# Patient Record
Sex: Female | Born: 1950 | Race: Black or African American | Hispanic: No | Marital: Single | Smoking: Never smoker
Health system: Southern US, Community
[De-identification: ages and names within clinical notes are randomized; demographics above are authoritative.]

## PROBLEM LIST (undated history)

## (undated) DIAGNOSIS — I10 Essential (primary) hypertension: Secondary | ICD-10-CM

## (undated) HISTORY — PX: TUBAL LIGATION: SHX77

## (undated) HISTORY — PX: ROTATOR CUFF REPAIR: SHX139

---

## 2000-02-09 ENCOUNTER — Emergency Department (HOSPITAL_COMMUNITY): Admission: EM | Admit: 2000-02-09 | Discharge: 2000-02-09 | Payer: Self-pay | Admitting: Emergency Medicine

## 2000-03-17 ENCOUNTER — Emergency Department (HOSPITAL_COMMUNITY): Admission: EM | Admit: 2000-03-17 | Discharge: 2000-03-17 | Payer: Self-pay | Admitting: Emergency Medicine

## 2008-02-27 ENCOUNTER — Emergency Department (HOSPITAL_BASED_OUTPATIENT_CLINIC_OR_DEPARTMENT_OTHER): Admission: EM | Admit: 2008-02-27 | Discharge: 2008-02-27 | Payer: Self-pay | Admitting: Emergency Medicine

## 2008-12-02 ENCOUNTER — Emergency Department (HOSPITAL_BASED_OUTPATIENT_CLINIC_OR_DEPARTMENT_OTHER): Admission: EM | Admit: 2008-12-02 | Discharge: 2008-12-02 | Payer: Self-pay | Admitting: Emergency Medicine

## 2010-06-05 ENCOUNTER — Emergency Department (INDEPENDENT_AMBULATORY_CARE_PROVIDER_SITE_OTHER): Payer: Self-pay

## 2010-06-05 ENCOUNTER — Emergency Department (HOSPITAL_BASED_OUTPATIENT_CLINIC_OR_DEPARTMENT_OTHER)
Admission: EM | Admit: 2010-06-05 | Discharge: 2010-06-05 | Disposition: A | Discharge status: Home / Self Care | Payer: Self-pay | Attending: Emergency Medicine | Admitting: Emergency Medicine

## 2010-06-05 DIAGNOSIS — X500XXA Overexertion from strenuous movement or load, initial encounter: Secondary | ICD-10-CM | POA: Insufficient documentation

## 2010-06-05 DIAGNOSIS — Y9289 Other specified places as the place of occurrence of the external cause: Secondary | ICD-10-CM | POA: Insufficient documentation

## 2010-06-05 DIAGNOSIS — I10 Essential (primary) hypertension: Secondary | ICD-10-CM | POA: Insufficient documentation

## 2010-06-05 DIAGNOSIS — Y9269 Other specified industrial and construction area as the place of occurrence of the external cause: Secondary | ICD-10-CM

## 2010-06-05 DIAGNOSIS — S6390XA Sprain of unspecified part of unspecified wrist and hand, initial encounter: Secondary | ICD-10-CM | POA: Insufficient documentation

## 2010-06-05 DIAGNOSIS — Y93F9 Activity, other caregiving: Secondary | ICD-10-CM

## 2010-06-05 DIAGNOSIS — M79609 Pain in unspecified limb: Secondary | ICD-10-CM | POA: Insufficient documentation

## 2012-01-25 ENCOUNTER — Emergency Department (HOSPITAL_BASED_OUTPATIENT_CLINIC_OR_DEPARTMENT_OTHER)
Admission: EM | Admit: 2012-01-25 | Discharge: 2012-01-25 | Disposition: A | Discharge status: Home / Self Care | Payer: Worker's Compensation | Attending: Emergency Medicine | Admitting: Emergency Medicine

## 2012-01-25 ENCOUNTER — Emergency Department (HOSPITAL_BASED_OUTPATIENT_CLINIC_OR_DEPARTMENT_OTHER): Payer: Worker's Compensation

## 2012-01-25 ENCOUNTER — Encounter (HOSPITAL_BASED_OUTPATIENT_CLINIC_OR_DEPARTMENT_OTHER): Payer: Self-pay | Admitting: Family Medicine

## 2012-01-25 DIAGNOSIS — Y9210 Unspecified residential institution as the place of occurrence of the external cause: Secondary | ICD-10-CM | POA: Insufficient documentation

## 2012-01-25 DIAGNOSIS — Y93F2 Activity, caregiving, lifting: Secondary | ICD-10-CM | POA: Insufficient documentation

## 2012-01-25 DIAGNOSIS — S4980XA Other specified injuries of shoulder and upper arm, unspecified arm, initial encounter: Secondary | ICD-10-CM | POA: Insufficient documentation

## 2012-01-25 DIAGNOSIS — S46909A Unspecified injury of unspecified muscle, fascia and tendon at shoulder and upper arm level, unspecified arm, initial encounter: Secondary | ICD-10-CM | POA: Insufficient documentation

## 2012-01-25 DIAGNOSIS — Y990 Civilian activity done for income or pay: Secondary | ICD-10-CM | POA: Insufficient documentation

## 2012-01-25 DIAGNOSIS — S46001A Unspecified injury of muscle(s) and tendon(s) of the rotator cuff of right shoulder, initial encounter: Secondary | ICD-10-CM

## 2012-01-25 DIAGNOSIS — X500XXA Overexertion from strenuous movement or load, initial encounter: Secondary | ICD-10-CM | POA: Insufficient documentation

## 2012-01-25 MED ORDER — OXYCODONE-ACETAMINOPHEN 5-325 MG PO TABS: 1.0000 | ORAL_TABLET | ORAL | Status: DC | PRN

## 2012-01-25 MED ORDER — NAPROXEN 500 MG PO TABS: 500.0000 mg | ORAL_TABLET | Freq: Two times a day (BID) | ORAL | Status: DC

## 2012-01-25 NOTE — ED Notes (Signed)
Pt c/o right shoulder pain x 2 wks. Pt reports lifting heavy pt at onset and limited rom since. Pt sts she has not taken otc meds.

## 2012-01-25 NOTE — ED Provider Notes (Signed)
History     CSN: [id number]  Arrival date & time 01/25/12  1038   First MD Initiated Contact with Patient 01/25/12 1142      Chief Complaint  Patient presents with  . Shoulder Pain    (Consider location/radiation/quality/duration/timing/severity/associated sxs/prior treatment) Patient is a 61 y.o. female presenting with shoulder pain. The history is provided by the patient.  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 Since then, she has had ongoing pain in her right shoulder which radiates to the right upper chest. Pain is severe and she rates it 10/10. Is worse with movement of the shoulder, worse at night, worse if she tries to prop her head on her right arm. She has taken ibuprofen with no significant improvement. She denies weakness, numbness, tingling.  History reviewed. No pertinent past medical history.  Past Surgical History  Procedure Date  . Tubal ligation   . Rotator cuff repair     left    No family history on file.  History  Substance Use Topics  . Smoking status: Never Smoker   . Smokeless tobacco: Not on file  . Alcohol Use: No    OB History    Grav Para Term Preterm Abortions TAB SAB Ect Mult Living                  Review of Systems  All other systems reviewed and are negative.    Allergies  Review of patient's allergies indicates no known allergies.  Home Medications  No current outpatient prescriptions on file.  BP 140/60  Pulse 65  Temp 97.7 F (36.5 C) (Oral)  Resp 16  Ht 5\' 5"  (1.651 m)  Wt 210 lb (95.255 kg)  BMI 34.95 kg/m2  SpO2 100%  Physical Exam  Nursing note and vitals reviewed. 61 year old female, resting comfortably and in no acute distress. Vital signs are normal. Oxygen saturation is 100%, which is normal. Head is normocephalic and atraumatic. PERRLA, EOMI. Oropharynx is clear. Neck is nontender and supple without adenopathy or JVD. Back is nontender and there is no  CVA tenderness. Lungs are clear without rales, wheezes, or rhonchi. Chest is nontender. Heart has regular rate and rhythm without murmur. Abdomen is soft, flat, nontender without masses or hepatosplenomegaly and peristalsis is normoactive. Extremities have no cyanosis or edema. There is moderate tenderness of the right shoulder in the anterior deltoid groove. She has limited abduction to approximately 70. Rotator cuff impingement signs are present. Neurovascular exam is intact with prompt capillary refill, normal sensation, and normal strength of distal muscles. Skin is warm and dry without rash. Neurologic: Mental status is normal, cranial nerves are intact, there are no motor or sensory deficits.   ED Course  Procedures (including critical care time)  Dg Shoulder Right  01/25/2012  *RADIOLOGY REPORT*  Clinical Data: Right shoulder pain  RIGHT SHOULDER - 2+ VIEW  Comparison: None.  Findings: No fracture or dislocation.  Right lung apex is clear. AC joint distance is normal allowing for technique.  IMPRESSION: No right shoulder fracture or dislocation.   Original Report Authenticated By: Harrel Lemon, M.D.      1. Injury of right rotator cuff       MDM  Rotator cuff injury on the right shoulder. She is discharged with prescription for naproxen and Percocet and referred to the on-call orthopedic specialist.        Dione Booze, MD 01/25/12 1224

## 2012-04-01 ENCOUNTER — Encounter (HOSPITAL_BASED_OUTPATIENT_CLINIC_OR_DEPARTMENT_OTHER): Payer: Self-pay | Admitting: Emergency Medicine

## 2012-04-01 ENCOUNTER — Emergency Department (HOSPITAL_BASED_OUTPATIENT_CLINIC_OR_DEPARTMENT_OTHER)
Admission: EM | Admit: 2012-04-01 | Discharge: 2012-04-02 | Disposition: A | Discharge status: Home / Self Care | Payer: Self-pay | Attending: Emergency Medicine | Admitting: Emergency Medicine

## 2012-04-01 DIAGNOSIS — X58XXXA Exposure to other specified factors, initial encounter: Secondary | ICD-10-CM | POA: Insufficient documentation

## 2012-04-01 DIAGNOSIS — M719 Bursopathy, unspecified: Secondary | ICD-10-CM | POA: Insufficient documentation

## 2012-04-01 DIAGNOSIS — Y939 Activity, unspecified: Secondary | ICD-10-CM | POA: Insufficient documentation

## 2012-04-01 DIAGNOSIS — Y929 Unspecified place or not applicable: Secondary | ICD-10-CM | POA: Insufficient documentation

## 2012-04-01 DIAGNOSIS — M67919 Unspecified disorder of synovium and tendon, unspecified shoulder: Secondary | ICD-10-CM | POA: Insufficient documentation

## 2012-04-01 DIAGNOSIS — Z791 Long term (current) use of non-steroidal anti-inflammatories (NSAID): Secondary | ICD-10-CM | POA: Insufficient documentation

## 2012-04-01 NOTE — ED Notes (Signed)
Pt denies taking any OTC meds tonight for pain.

## 2012-04-01 NOTE — ED Notes (Signed)
Pt c/o right shoulder pain. Pt has been seen here for same previously

## 2012-04-02 ENCOUNTER — Encounter (HOSPITAL_BASED_OUTPATIENT_CLINIC_OR_DEPARTMENT_OTHER): Payer: Self-pay | Admitting: Emergency Medicine

## 2012-04-02 MED ORDER — OXYCODONE-ACETAMINOPHEN 5-325 MG PO TABS: 1.0000 | ORAL_TABLET | Freq: Four times a day (QID) | ORAL | Status: DC | PRN

## 2012-04-02 MED ORDER — NAPROXEN 375 MG PO TABS: 375.0000 mg | ORAL_TABLET | Freq: Two times a day (BID) | ORAL | Status: DC

## 2012-04-02 NOTE — ED Provider Notes (Signed)
History     CSN: [id number]  Arrival date & time 04/01/12  2334   First MD Initiated Contact with Patient 04/01/12 2356      Chief Complaint  Patient presents with  . Shoulder Injury    (Consider location/radiation/quality/duration/timing/severity/associated sxs/prior treatment) Patient is a 61 y.o. female presenting with shoulder injury. The history is provided by the patient. No language interpreter was used.  Shoulder Injury This is a chronic problem. The current episode started more than 1 week ago. The problem has not changed since onset.Pertinent negatives include no chest pain, no abdominal pain, no headaches and no shortness of breath. Exacerbated by: lifting the right arm. Nothing relieves the symptoms. She has tried nothing for the symptoms. The treatment provided no relief.  Has had rotator cuff problems with negative XR.  Was referred to orthopedics for ongoing care but has not followed up.  No new injuries no weakness no numbness.    History reviewed. No pertinent past medical history.  Past Surgical History  Procedure Date  . Tubal ligation   . Rotator cuff repair     left    No family history on file.  History  Substance Use Topics  . Smoking status: Never Smoker   . Smokeless tobacco: Not on file  . Alcohol Use: No    OB History    Grav Para Term Preterm Abortions TAB SAB Ect Mult Living                  Review of Systems  Respiratory: Negative for chest tightness and shortness of breath.   Cardiovascular: Negative for chest pain and palpitations.  Gastrointestinal: Negative for abdominal pain.  Neurological: Negative for headaches.  All other systems reviewed and are negative.    Allergies  Review of patient's allergies indicates no known allergies.  Home Medications   Current Outpatient Rx  Name  Route  Sig  Dispense  Refill  . NAPROXEN 500 MG PO TABS   Oral   Take 1 tablet (500 mg total) by mouth 2 (two) times daily.   30 tablet    0   . OXYCODONE-ACETAMINOPHEN 5-325 MG PO TABS   Oral   Take 1 tablet by mouth every 4 (four) hours as needed for pain.   20 tablet   0     BP 155/81  Pulse 67  Temp 98.2 F (36.8 C) (Oral)  Resp 18  SpO2 99%  Physical Exam  Constitutional: She is oriented to person, place, and time. She appears well-developed and well-nourished. No distress.  HENT:  Head: Normocephalic and atraumatic.  Mouth/Throat: Oropharynx is clear and moist.  Eyes: Conjunctivae normal are normal. Pupils are equal, round, and reactive to light.  Neck: Normal range of motion. Neck supple. No tracheal deviation present.       No C T step offs or pain.    Cardiovascular: Normal rate, regular rhythm and intact distal pulses.   Pulmonary/Chest: Effort normal and breath sounds normal. She has no wheezes. She has no rales.  Abdominal: Soft. Bowel sounds are normal. There is no tenderness.  Musculoskeletal: Normal range of motion. She exhibits no edema and no tenderness.       No winging of the scapula Negative NEERs tests of the right shoulder.  Intact biceps tendon.  5/5 RUE strength cap refill < 2 sec to all fingers of the right hand no snuff box tenderness.  Sensation intact to all nerve distributions  Neurological: She is alert and  oriented to person, place, and time. She has normal reflexes.  Skin: Skin is warm and dry.  Psychiatric: She has a normal mood and affect.    ED Course  Procedures (including critical care time)  Labs Reviewed - No data to display No results found.   No diagnosis found.    MDM  Follow up with orthopedics for ongoing care.  Will give short course of pain meds.  Patient verbalizes understanding and agrees to follow up       Delilah Mulgrew Smitty Cords, MD 04/02/12 1610

## 2012-04-02 NOTE — ED Notes (Signed)
Rx x 2 given for naprosyn and percocet

## 2014-02-27 ENCOUNTER — Emergency Department (HOSPITAL_BASED_OUTPATIENT_CLINIC_OR_DEPARTMENT_OTHER)
Admission: EM | Admit: 2014-02-27 | Discharge: 2014-02-27 | Disposition: A | Discharge status: Home / Self Care | Payer: Worker's Compensation | Attending: Emergency Medicine | Admitting: Emergency Medicine

## 2014-02-27 ENCOUNTER — Emergency Department (HOSPITAL_BASED_OUTPATIENT_CLINIC_OR_DEPARTMENT_OTHER): Payer: Worker's Compensation

## 2014-02-27 ENCOUNTER — Encounter (HOSPITAL_BASED_OUTPATIENT_CLINIC_OR_DEPARTMENT_OTHER): Payer: Self-pay | Admitting: Emergency Medicine

## 2014-02-27 DIAGNOSIS — Y998 Other external cause status: Secondary | ICD-10-CM | POA: Insufficient documentation

## 2014-02-27 DIAGNOSIS — W07XXXA Fall from chair, initial encounter: Secondary | ICD-10-CM | POA: Insufficient documentation

## 2014-02-27 DIAGNOSIS — S3992XA Unspecified injury of lower back, initial encounter: Secondary | ICD-10-CM | POA: Diagnosis not present

## 2014-02-27 DIAGNOSIS — Y9289 Other specified places as the place of occurrence of the external cause: Secondary | ICD-10-CM | POA: Diagnosis not present

## 2014-02-27 DIAGNOSIS — Y9389 Activity, other specified: Secondary | ICD-10-CM | POA: Diagnosis not present

## 2014-02-27 DIAGNOSIS — S199XXA Unspecified injury of neck, initial encounter: Secondary | ICD-10-CM | POA: Diagnosis not present

## 2014-02-27 DIAGNOSIS — S4990XA Unspecified injury of shoulder and upper arm, unspecified arm, initial encounter: Secondary | ICD-10-CM | POA: Insufficient documentation

## 2014-02-27 DIAGNOSIS — S99921A Unspecified injury of right foot, initial encounter: Secondary | ICD-10-CM | POA: Diagnosis not present

## 2014-02-27 DIAGNOSIS — W19XXXA Unspecified fall, initial encounter: Secondary | ICD-10-CM

## 2014-02-27 NOTE — ED Provider Notes (Signed)
CSN: [id number]     Arrival date & time 11/[id number]/15  1527 History   First MD Initiated Contact with Patient 02/27/14 1540     Chief Complaint  Patient presents with  . Fall     (Consider location/radiation/quality/duration/timing/severity/associated sxs/prior Treatment) Patient is a 63 y.o. female presenting with fall.  Fall This is a new problem. Episode onset: 10 days ago. The problem occurs constantly. The problem has not changed since onset.Pertinent negatives include no chest pain, no abdominal pain, no headaches and no shortness of breath. Nothing aggravates the symptoms. Nothing relieves the symptoms. She has tried nothing for the symptoms.    No past medical history on file. Past Surgical History  Procedure Laterality Date  . Tubal ligation    . Rotator cuff repair      left   No family history on file. History  Substance Use Topics  . Smoking status: Never Smoker   . Smokeless tobacco: Not on file  . Alcohol Use: No   OB History    No data available     Review of Systems  Respiratory: Negative for shortness of breath.   Cardiovascular: Negative for chest pain.  Gastrointestinal: Negative for abdominal pain.  Neurological: Negative for headaches.  All other systems reviewed and are negative.     Allergies  Review of patient's allergies indicates no known allergies.  Home Medications   Prior to Admission medications   Not on File   BP 148/87 mmHg  Pulse 74  Temp(Src) 98.2 F (36.8 C) (Oral)  Resp 20  Ht 5\' 4"  (1.626 m)  Wt 229 lb (103.874 kg)  BMI 39.29 kg/m2  SpO2 100% Physical Exam  Constitutional: She is oriented to person, place, and time. She appears well-developed and well-nourished.  HENT:  Head: Normocephalic and atraumatic.  Right Ear: External ear normal.  Left Ear: External ear normal.  Eyes: Conjunctivae and EOM are normal. Pupils are equal, round, and reactive to light.  Neck: Normal range of motion. Neck supple.  Cardiovascular:  Normal rate, regular rhythm, normal heart sounds and intact distal pulses.   Pulmonary/Chest: Effort normal and breath sounds normal.  Abdominal: Soft. Bowel sounds are normal. There is no tenderness.  Musculoskeletal: Normal range of motion.  Tenderness to midline cervical spine, upper t spine, scapula, and R dorsum of foot  Neurological: She is alert and oriented to person, place, and time.  Skin: Skin is warm and dry.  Vitals reviewed.   ED Course  Procedures (including critical care time) Labs Review Labs Reviewed - No data to display  Imaging Review Dg Cervical Spine Complete  02/27/2014   CLINICAL DATA:  63 year old female with neck pain after falling on the job 2 weeks ago  EXAM: CERVICAL SPINE  4+ VIEWS  COMPARISON:  CT scan of the head and cervical spine 02/27/2008  FINDINGS: No evidence of acute fracture or malalignment. No prevertebral soft tissue swelling. Mild multilevel cervical spondylosis at C3-C4, C4-C5 and C5-C6. Minimal change noted at C6-C7. No evidence of foraminal stenosis on the oblique radiographs. The dens is intact. Normal bony mineralization. No lytic of blastic osseous lesion. Unremarkable visualized upper lungs.  IMPRESSION: 1. No acute fracture or malalignment. 2. Mild multilevel cervical spondylosis without evidence of bony foraminal stenosis.   Electronically Signed   By: Malachy MoanHeath  McCullough M.D.   On: 02/27/2014 16:57   Dg Thoracic Spine 2 View  02/27/2014   CLINICAL DATA:  Larey SeatFell 2 weeks ago on the job. Persistent mid back  pain.  EXAM: THORACIC SPINE - 2 VIEW  COMPARISON:  None.  FINDINGS: Normal alignment of the thoracic vertebral bodies. Disc spaces and vertebral bodies are maintained. No acute compression fracture. No abnormal paraspinal soft tissue thickening. The visualized posterior ribs are intact.  IMPRESSION: Normal alignment and no acute bony findings. Mild to moderate degenerative changes.   Electronically Signed   By: Loralie ChampagneMark  Gallerani M.D.   On:  02/27/2014 16:57   Dg Shoulder Right  02/27/2014   CLINICAL DATA:  Larey SeatFell on the job 2 weeks ago. Persistent shoulder pain.  EXAM: RIGHT SHOULDER - 2+ VIEW  COMPARISON:  01/25/2012.  FINDINGS: The joint spaces are maintained. Mild AC and glenohumeral joint degenerative changes. No acute fracture. The visualized right ribs are intact and the right lung apex is clear.  IMPRESSION: Mild degenerative changes but no acute bony findings.   Electronically Signed   By: Loralie ChampagneMark  Gallerani M.D.   On: 02/27/2014 16:58   Dg Foot Complete Right  02/27/2014   CLINICAL DATA:  Fall x 2 weeks again on the job. [facility]-5[facility] with knot at base of 3rd MT. No old injury known.  EXAM: RIGHT FOOT COMPLETE - 3+ VIEW  COMPARISON:  None.  FINDINGS: No fracture. Joints are normally spaced and aligned. Small plantar calcaneal spur. Area soft tissue prominence at the tarsometatarsal articulation on the lateral view. No other soft tissue abnormality.  IMPRESSION: No fracture or dislocation.   Electronically Signed   By: Amie Portlandavid  Ormond M.D.   On: 02/27/2014 16:58     EKG Interpretation None      MDM   Final diagnoses:  Fall    63 y.o. female without pertinent PMH presents with continued soreness after fall approximately 10 days ago. Patient states that she tripped over a table and fell, did not hit her head, did not lose consciousness, and had mild self-limited nausea which is now resolved. She presents today with soreness in her neck, shoulder, and right foot. On arrival vital signs and physical exam as above. Patient has tenderness in above locations without bruising, crepitus, limited range of motion. X-rays obtained and unremarkable. Likely contusion which should self resolve. Counseled patient and strict return precautions and use of NSAIDs.  1. Nicholaus BloomFall         Matthew Gentry, MD 02/27/14 [phone]

## 2014-02-27 NOTE — ED Notes (Signed)
Pt fell on table approximately 10 days ago.  Pt having pain on right foot, radiating to knee, and both shoulders radiating to neck.    No LOC.

## 2014-02-27 NOTE — Discharge Instructions (Signed)
Your Xrays are unremarkable for acute fracture, but these will not detect injury to ligaments.  You should followup with your orthopedist and see a PCP. Contusion A contusion is a deep bruise. Contusions are the result of an injury that caused bleeding under the skin. The contusion may turn blue, purple, or yellow. Minor injuries will give you a painless contusion, but more severe contusions may stay painful and swollen for a few weeks.  CAUSES  A contusion is usually caused by a blow, trauma, or direct force to an area of the body. SYMPTOMS   Swelling and redness of the injured area.  Bruising of the injured area.  Tenderness and soreness of the injured area.  Pain. DIAGNOSIS  The diagnosis can be made by taking a history and physical exam. An X-ray, CT scan, or MRI may be needed to determine if there were any associated injuries, such as fractures. TREATMENT  Specific treatment will depend on what area of the body was injured. In general, the best treatment for a contusion is resting, icing, elevating, and applying cold compresses to the injured area. Over-the-counter medicines may also be recommended for pain control. Ask your caregiver what the best treatment is for your contusion. HOME CARE INSTRUCTIONS   Put ice on the injured area.  Put ice in a plastic bag.  Place a towel between your skin and the bag.  Leave the ice on for 15-20 minutes, 3-4 times a day, or as directed by your health care provider.  Only take over-the-counter or prescription medicines for pain, discomfort, or fever as directed by your caregiver. Your caregiver may recommend avoiding anti-inflammatory medicines (aspirin, ibuprofen, and naproxen) for 48 hours because these medicines may increase bruising.  Rest the injured area.  If possible, elevate the injured area to reduce swelling. SEEK IMMEDIATE MEDICAL CARE IF:   You have increased bruising or swelling.  You have pain that is getting worse.  Your  swelling or pain is not relieved with medicines. MAKE SURE YOU:   Understand these instructions.  Will watch your condition.  Will get help right away if you are not doing well or get worse. Document Released: 01/07/2005 Document Revised: 04/04/2013 Document Reviewed: 02/02/2011 [facility] Patient Information 2015 [place], [place]. This information is not intended to replace advice given to you by your health care provider. Make sure you discuss any questions you have with your health care provider.    Emergency Department Resource Guide 1) Find a Doctor and Pay Out of Pocket Although you won't have to find out who is covered by your insurance plan, it is a good idea to ask around and get recommendations. You will then need to call the office and see if the doctor you have chosen will accept you as a new patient and what types of options they offer for patients who are self-pay. Some doctors offer discounts or will set up payment plans for their patients who do not have insurance, but you will need to ask so you aren't surprised when you get to your appointment.  2) Contact Your Local Health Department Not all health departments have doctors that can see patients for sick visits, but many do, so it is worth a call to see if yours does. If you don't know where your local health department is, you can check in your phone book. The CDC also has a tool to help you locate your state's health department, and many state websites also have listings of all of their  local health departments.  3) Find a Walk-in Clinic If your illness is not likely to be very severe or complicated, you may want to try a walk in clinic. These are popping up all over the country in pharmacies, drugstores, and shopping centers. They're usually staffed by nurse practitioners or physician assistants that have been trained to treat common illnesses and complaints. They're usually fairly quick and inexpensive. However, if you have  serious medical issues or chronic medical problems, these are probably not your best option.  No Primary Care Doctor: - Call Health Connect at  [phone] - they can help you locate a primary care doctor that  accepts your insurance, provides certain services, etc. - Physician Referral Service- [phone]  Chronic Pain Problems: Organization         Address  Phone   Notes  [facility]  [phone] Patients need to be referred by their primary care doctor.   Medication Assistance: Organization         Address  Phone   Notes  [facility] Medication [facility] [address] [place] [place], [place] [address] [phone] --Must be a resident of [facility] -- Must have NO insurance coverage whatsoever (no Medicaid/ Medicare, etc.) -- The pt. MUST have a primary care doctor that directs their care regularly and follows them in the community   [facility]  [phone]   [employer]  [phone]    Agencies that provide inexpensive medical care: Organization         Address  Phone   Notes  [facility]  [phone]   [facility]    [phone]   [facility] [address] [place], [place] [address] [phone]   [facility] [address] [place], [place] [phone]   Planned Parenthood    [phone]   [facility]    [phone]   [facility]  [address], [place] Phone:  [phone], Fax:  [phone] Hours of Operation:  9 am - 6 pm, M-F.  Also accepts Medicaid/Medicare and self-pay.  [facility]  [address], [place] Phone: [phone], Fax: [phone]. Hours of Operation:  8:30 am - 5:30 pm, M-F.  Also accepts Medicaid and self-pay.  [facility] [place] [address], [place] Phone: [phone]   [facility] [address] Natasha Bence [place], [place] [phone], Ext. [phone] Mondays & Thursdays: 7-9 AM.  First 15 patients are seen on a first come, first serve basis.    Medicaid-accepting [facility] Providers:  Organization         Address  Phone   Notes  [facility] [address], [place] [phone] Also accepts self-pay patients.  [facility] [address] Laurell Josephs [place], [place]  [phone]   [facility] [address], [place] [phone]   [facility] [address] [place] [phone]   Renaye Rakers [address], [place]   [phone] Only accepts [place] patients after they have their name applied to their card.   Self-Pay (no insurance) in [facility]:  Organization         Address  Phone   Notes  Sickle Cell Patients,  [facility] [address] [place], [place] [phone]   [facility] [address] [employer], [place] [phone]   [facility] [place]  [address],  [phone]   [facility]/Dr. Osei-Bonsu  [address] [place] or [address], [place] [phone] Phone number for both [place] and [place] locations is the same.  [facility] [address], [place] [phone]   [facility] [address], [place] or [address] [phone]   [facility] [address], [place] [phone], phone; [phone], fax Sees patients 1st and 3rd Saturday of every month.  Must not qualify for public or private insurance (i.e. Medicaid, Medicare, [place] Health Choice, Veterans' Benefits)  Household income should be no more than 200% of the poverty level The clinic cannot treat you if you are pregnant or think you are pregnant   Sexually transmitted diseases are not treated at the clinic.    Dental Care: Organization         Address  Phone  Notes  [facility] Department of [facility] [address] [place], [place] [phone] Accepts children up to age 43 who are enrolled in [place] or [place] Health Choice; pregnant women with a Medicaid card; and children who have applied for Medicaid or [place] Health Choice, but were declined, whose parents can pay a reduced fee at time of service.  [facility] Department of [facility]  [address], [place] [phone] Accepts children up to age 48 who are enrolled in [place] or  Health Choice; pregnant women with a Medicaid card; and children who have applied for Medicaid or  Health Choice, but were declined, whose parents can pay a reduced fee at time of service.  [facility]  [address] [place], [place] [phone] Patients are seen by appointment only. Walk-ins are not accepted. [facility] will see patients 21 years of age and older. Monday - Tuesday (8am-5pm) Most Wednesdays (8:30-5pm) $30 per visit, cash only  [facility] Adult Dental Access PRO[facility]  [address], [facility] [phone] Patients are seen by appointment only. Walk-ins are not accepted. [facility] will see patients 19 years of age and older. One Wednesday Evening (Monthly: Volunteer Based).  $30 per visit, cash only  [employer]  [phone] for adults; Children under age 60, call Graduate Pediatric Dentistry at [phone]. Children aged 47-14, please call [phone] to request a pediatric application.  Dental services are provided in all areas of dental care including fillings, crowns and bridges, complete and partial dentures, implants, gum treatment, root canals, and extractions. Preventive care is also provided. Treatment is provided to both adults and children. Patients  are selected via a lottery and there is often a waiting list.   [facility] [address] [place]  [phone] [email or web address]   [facility] [address] [place], [place] [phone], Ext. [phone] Second and Fourth Thursday of each month, opens at 6:30 AM; Clinic ends at 9 AM.  Patients are seen on a first-come first-served basis, and a limited number are seen during each clinic.   [facility]  [address] Ether [place], [place] [phone]   Eligibility Requirements You must  have lived in [place], [place], or [place] for at least the last three months.   You cannot be eligible for state or federal sponsored [place]healthcare insurance, including CIG[employer]eterans Administration, [place]icaid, or [employer]icare.   You generally cannot be eligible for healthcare insurance through your employer.    How to apply: Eligibility screenings are held every Tuesday and Wednesday afternoon from 1:00 pm until 4:00 pm. You do not need an appointment for the interview!  [facility] [address], [place], [place] [phone]   [facility]  [phone]   [facility]  [phone]   [facility]  [phone]    Behavioral Health Resources in the Community: Intensive Outpatient Programs Organization         Address  Phone  Notes  [facility] [address], [place], [place] [phone]   [facility] [address], [place], [place] [phone]   [facility] [address], [place], [place]  [phone]   [facility] [address],  [place], [place] [phone] or [phone]   Substance Abuse Resources Organization         Address  Phone  Notes  [facility]  [phone]   [facility]  [phone]   [facility]  [phone]   [facility]  [phone]    Residential & Outpatient Substance Abuse Program  [phone]   Psychological Services Organization         Address  Phone  Notes  [facility]  [phone]   [facility]  [phone]- [phone]   [facility] [address], [place] [phone] or [phone]    Mobile Crisis Teams Organization         Address  Phone  Notes  [facility], Mobile Crisis Care Unit  [phone]   [facility]  [address] [place], [place] [phone]   Doristine LocksSharon DeEsch [address] [place] [phone]    Self-Help/Support Groups Organization         Address  Phone             Notes  [facility] [place] - variety of support groups  336- [phone] Call for more information  Narcotics Anonymous (NA), [facility] [address], [place]  2 meetings at this location   Statisticianesidential Treatment Programs Organization         Address  Phone  Notes  [facility] [address],    [place]  [phone]   [facility]  [address], [place] [phone], [place], [place] [phone]   [facility] [address], [place] [phone] Admissions: 8am-3pm M-F  [facility] [address]    [place], [place] [phone]   [facility] [address], [place], [place] [phone]   [facility] [address]  [place], [place] [phone]   [facility] - Intensive Outpatient [address], [place], [place] [phone]   [facility] ([facility]iction Recovery [facility]oc[facility] [address]  [place], [place] [phone] or [phone]   [facility] ([facility]) [address] [place], [place] [phone] Accepts Medicaid  [facility] [address]  [place] [phone] Substance Abuse/Addiction Treatment   [facility]  Resources Organization         Address  Phone  Notes  [facility]  [phone]   Angie FavaJulie Brannon, PhD [address], [address] [place], [place]   [phone] or [phone](336) [phone]   [facility]   [address] [place], [place] [phone](336) [phone]   [facility] [address], [place], [place] [phone](336) [phone] Insurance/Medicaid/sponsorship through [facility]  [facility] [address]                                    [place], [place] [phone](336) [phone] Therapy/tele-psych/case  [facility] [address].   [place], [place] [phone]    Dr. Lolly MustacheArfeen  [phone]   [facility] [place]  [employer] [facility] 1) [address], [place] 2) [address], [place] 3)  [address] [place] [address], [place] [phone]  [phone]   [facility] Child Abuse Hotline [phone] or [phone] (After Hours)

## 2014-03-03 ENCOUNTER — Emergency Department (HOSPITAL_BASED_OUTPATIENT_CLINIC_OR_DEPARTMENT_OTHER)
Admission: EM | Admit: 2014-03-03 | Discharge: 2014-03-03 | Disposition: A | Discharge status: Home / Self Care | Payer: Worker's Compensation | Attending: Emergency Medicine | Admitting: Emergency Medicine

## 2014-03-03 ENCOUNTER — Encounter (HOSPITAL_BASED_OUTPATIENT_CLINIC_OR_DEPARTMENT_OTHER): Payer: Self-pay

## 2014-03-03 DIAGNOSIS — G8911 Acute pain due to trauma: Secondary | ICD-10-CM | POA: Insufficient documentation

## 2014-03-03 DIAGNOSIS — R42 Dizziness and giddiness: Secondary | ICD-10-CM | POA: Insufficient documentation

## 2014-03-03 DIAGNOSIS — M25562 Pain in left knee: Secondary | ICD-10-CM | POA: Insufficient documentation

## 2014-03-03 DIAGNOSIS — M25512 Pain in left shoulder: Secondary | ICD-10-CM | POA: Insufficient documentation

## 2014-03-03 DIAGNOSIS — M25561 Pain in right knee: Secondary | ICD-10-CM

## 2014-03-03 LAB — CBC WITH DIFFERENTIAL/PLATELET
BASOS ABS: 0 10*3/uL (ref 0.0–0.1)
BASOS PCT: 0 % (ref 0–1)
EOS ABS: 0.1 10*3/uL (ref 0.0–0.7)
EOS PCT: 2 % (ref 0–5)
HEMATOCRIT: 41.2 % (ref 36.0–46.0)
HEMOGLOBIN: 13.3 g/dL (ref 12.0–15.0)
LYMPHS PCT: 48 % — AB (ref 12–46)
Lymphs Abs: 2.2 10*3/uL (ref 0.7–4.0)
MCH: 26.7 pg (ref 26.0–34.0)
MCHC: 32.3 g/dL (ref 30.0–36.0)
MCV: 82.7 fL (ref 78.0–100.0)
Monocytes Absolute: 0.4 10*3/uL (ref 0.1–1.0)
Monocytes Relative: 8 % (ref 3–12)
Neutro Abs: 2 10*3/uL (ref 1.7–7.7)
Neutrophils Relative %: 42 % — ABNORMAL LOW (ref 43–77)
PLATELETS: 267 10*3/uL (ref 150–400)
RBC: 4.98 MIL/uL (ref 3.87–5.11)
RDW: 12.6 % (ref 11.5–15.5)
WBC: 4.7 10*3/uL (ref 4.0–10.5)

## 2014-03-03 LAB — BASIC METABOLIC PANEL
ANION GAP: 15 (ref 5–15)
BUN: 10 mg/dL (ref 6–23)
CHLORIDE: 101 meq/L (ref 96–112)
CO2: 23 meq/L (ref 19–32)
Calcium: 9.4 mg/dL (ref 8.4–10.5)
Creatinine, Ser: 0.8 mg/dL (ref 0.50–1.10)
GFR calc Af Amer: 90 mL/min — ABNORMAL LOW (ref >90)
GFR, EST NON AFRICAN AMERICAN: 77 mL/min — AB (ref >90)
Glucose, Bld: 95 mg/dL (ref 70–99)
Potassium: 4 mEq/L (ref 3.7–5.3)
SODIUM: 139 meq/L (ref 137–147)

## 2014-03-03 LAB — URINALYSIS, ROUTINE W REFLEX MICROSCOPIC
Bilirubin Urine: NEGATIVE
Glucose, UA: NEGATIVE mg/dL
Hgb urine dipstick: NEGATIVE
Ketones, ur: NEGATIVE mg/dL
Leukocytes, UA: NEGATIVE
Nitrite: NEGATIVE
PH: 6.5 (ref 5.0–8.0)
Protein, ur: NEGATIVE mg/dL
SPECIFIC GRAVITY, URINE: 1.008 (ref 1.005–1.030)
UROBILINOGEN UA: 0.2 mg/dL (ref 0.0–1.0)

## 2014-03-03 MED ORDER — MECLIZINE HCL 50 MG PO TABS: 50.0000 mg | ORAL_TABLET | Freq: Three times a day (TID) | ORAL | Status: DC | PRN

## 2014-03-03 MED ORDER — TRAMADOL HCL 50 MG PO TABS: 50.0000 mg | ORAL_TABLET | Freq: Four times a day (QID) | ORAL | Status: DC | PRN

## 2014-03-03 MED ORDER — MECLIZINE HCL 25 MG PO TABS
25.0000 mg | ORAL_TABLET | Freq: Once | ORAL | Status: AC
  Administered 2014-03-03: 25 mg via ORAL
  Filled 2014-03-03: qty 1

## 2014-03-03 MED ORDER — SODIUM CHLORIDE 0.9 % IV BOLUS (SEPSIS)
1000.0000 mL | Freq: Once | INTRAVENOUS | Status: AC
  Administered 2014-03-03: 1000 mL via INTRAVENOUS

## 2014-03-03 NOTE — ED Notes (Signed)
Patient here with positional light headiness since last pm, patient reports that she feels off balance when she turns her head or looks down. Also wants bilateral knees and left shoulder checked from fall at work on 11/8

## 2014-03-03 NOTE — ED Provider Notes (Signed)
CSN: [id number]     Arrival date & time 03/03/14  1204 History   First MD Initiated Contact with Patient 03/03/14 1250     Chief Complaint  Patient presents with  . light headiness      (Consider location/radiation/quality/duration/timing/severity/associated sxs/prior Treatment) HPI   63 year old female presents complaining of lightheadedness. Patient reports yesterday she experiencing sensation of lightheadedness when moving from a sitting to a standing position. She went home to rest and this morning when getting out of bed once again patient felt lightheadedness. She reported sensation as feeling off balance especially with head movement. She denies having double vision, scotoma, ear pain, ringing in ears, neck pain, chest pain, shortness of breath, diaphoresis, dizziness, numbness or weakness, denies dysuria, or abnormal bleeding. She has been eating and drinking as usual and no medication changes. Patient also reports that she had a mechanical fall on 11/8. States he fell forward breaking her fall with both hands and knees. She was seen 4 days afterward because she was having bilateral shoulder pains and knee pain. States x-ray was obtained without any acute fractures. Since this is still having intermittent pain to her shoulders and knees but able to move without difficulty. She would like to have it reevaluated.   History reviewed. No pertinent past medical history. Past Surgical History  Procedure Laterality Date  . Tubal ligation    . Rotator cuff repair      left   No family history on file. History  Substance Use Topics  . Smoking status: Never Smoker   . Smokeless tobacco: Not on file  . Alcohol Use: No   OB History    No data available     Review of Systems  All other systems reviewed and are negative.      Allergies  Review of patient's allergies indicates no known allergies.  Home Medications   Prior to Admission medications   Not on File   BP 150/105  mmHg  Pulse 76  Temp(Src) 99.2 F (37.3 C) (Oral)  Resp 20  Wt 226 lb (102.513 kg)  SpO2 99% Physical Exam  Constitutional: She is oriented to person, place, and time. She appears well-developed and well-nourished. No distress.  HENT:  Head: Atraumatic.  Right Ear: External ear normal.  Left Ear: External ear normal.  Eyes: Conjunctivae and EOM are normal. Pupils are equal, round, and reactive to light.  Neck: Normal range of motion. Neck supple.  No nuchal rigidity  Cardiovascular: Normal rate and regular rhythm.   Pulmonary/Chest: Effort normal and breath sounds normal.  Abdominal: Soft. There is no tenderness.  Musculoskeletal: Normal range of motion.  Mild tenderness to anterior knees bilat, but has full ROM, and no deformity.  L shoulder with FROM, mildly tender to palpation without focal point tenderness.    Neurological: She is alert and oriented to person, place, and time.  Neurologic exam:  Speech clear, pupils equal round reactive to light, extraocular movements intact  Normal peripheral visual fields Cranial nerves III through XII normal including no facial droop Follows commands, moves all extremities x4, normal strength to bilateral upper and lower extremities at all major muscle groups including grip Sensation normal to light touch  Coordination intact, no limb ataxia, finger-nose-finger normal Rapid alternating movements normal No pronator drift Gait normal   Skin: No rash noted.  Psychiatric: She has a normal mood and affect.  Nursing note and vitals reviewed.   ED Course  Procedures (including critical care time)  1:12 PM  Pt with normal orthostatic vital sign, no focal neuro deficits, ambulate with normal gait.  Does report lightheadedeness with positional changes, likely peripheral vertigo. Low suspicion for stroke.  Will perform work up.    2:17 PM  EKG without any evidence to suggest acute ischemic changes or arrhythmia. UA without evidence of UTI,  normal H&H and normal electrolytes.  2:57 PM  patient reports she feels much better after receiving IV fluid and meclizine. She is able to ambulate without any difficulty. She requests for pain medication for her shoulder and her knee but otherwise she is stable for discharge. Return precautions discussed.  Labs Review Labs Reviewed  CBC WITH DIFFERENTIAL - Abnormal; Notable for the following:    Neutrophils Relative % 42 (*)    Lymphocytes Relative 48 (*)    All other components within normal limits  BASIC METABOLIC PANEL - Abnormal; Notable for the following:    GFR calc non Af Amer 77 (*)    GFR calc Af Amer 90 (*)    All other components within normal limits  URINALYSIS, ROUTINE W REFLEX MICROSCOPIC    Imaging Review No results found.   EKG Interpretation   Date/Time:  Saturday March 03 2014 13:12:51 EST Ventricular Rate:  89 PR Interval:  146 QRS Duration: 84 QT Interval:  396 QTC Calculation: 481 R Axis:   57 Text Interpretation:  Normal sinus rhythm Normal ECG No previous tracing  Confirmed by Anitra LauthPLUNKETT  MD, Alphonzo LemmingsWHITNEY ([id number]) on 03/03/2014 1:44:33 PM      MDM   Final diagnoses:  Positional lightheadedness    BP 150/105 mmHg  Pulse 76  Temp(Src) 99.2 F (37.3 C) (Oral)  Resp 20  Wt 226 lb (102.513 kg)  SpO2 99%  I have reviewed nursing notes and vital signs. I reviewed available ER/hospitalization records thought the EMR     Fayrene HelperBowie Delisa Finck, PA-C 03/03/14 1500  Gwyneth SproutWhitney Plunkett, MD 03/03/14 [phone]

## 2014-03-03 NOTE — Discharge Instructions (Signed)
Near-Syncope Near-syncope (commonly known as near fainting) is sudden weakness, dizziness, or feeling like you might pass out. During an episode of near-syncope, you may also develop pale skin, have tunnel vision, or feel sick to your stomach (nauseous). Near-syncope may occur when getting up after sitting or while standing for a long time. It is caused by a sudden decrease in blood flow to the brain. This decrease can result from various causes or triggers, most of which are not serious. However, because near-syncope can sometimes be a sign of something serious, a medical evaluation is required. The specific cause is often not determined. HOME CARE INSTRUCTIONS  Monitor your condition for any changes. The following actions may help to alleviate any discomfort you are experiencing:  Have someone stay with you until you feel stable.  Lie down right away and prop your feet up if you start feeling like you might faint. Breathe deeply and steadily. Wait until all the symptoms have passed. Most of these episodes last only a few minutes. You may feel tired for several hours.   Drink enough fluids to keep your urine clear or pale yellow.   If you are taking blood pressure or heart medicine, get up slowly when seated or lying down. Take several minutes to sit and then stand. This can reduce dizziness.  Follow up with your health care provider as directed. SEEK IMMEDIATE MEDICAL CARE IF:   You have a severe headache.   You have unusual pain in the chest, abdomen, or back.   You are bleeding from the mouth or rectum, or you have black or tarry stool.   You have an irregular or very fast heartbeat.   You have repeated fainting or have seizure-like jerking during an episode.   You faint when sitting or lying down.   You have confusion.   You have difficulty walking.   You have severe weakness.   You have vision problems.  MAKE SURE YOU:   Understand these instructions.  Will  watch your condition.  Will get help right away if you are not doing well or get worse. Document Released: 03/30/2005 Document Revised: 04/04/2013 Document Reviewed: 09/02/2012 ExitCare Patient Information 2015 [employer]. This information is not intended to replace advice given to you by your health care provider. Make sure you discuss any questions you have with your health care provider.  

## 2014-03-09 ENCOUNTER — Emergency Department (HOSPITAL_BASED_OUTPATIENT_CLINIC_OR_DEPARTMENT_OTHER)
Admission: EM | Admit: 2014-03-09 | Discharge: 2014-03-09 | Disposition: A | Discharge status: Home / Self Care | Payer: 59 | Attending: Emergency Medicine | Admitting: Emergency Medicine

## 2014-03-09 ENCOUNTER — Encounter (HOSPITAL_BASED_OUTPATIENT_CLINIC_OR_DEPARTMENT_OTHER): Payer: Self-pay

## 2014-03-09 DIAGNOSIS — W1839XA Other fall on same level, initial encounter: Secondary | ICD-10-CM | POA: Diagnosis not present

## 2014-03-09 DIAGNOSIS — M542 Cervicalgia: Secondary | ICD-10-CM | POA: Diagnosis not present

## 2014-03-09 DIAGNOSIS — S4991XD Unspecified injury of right shoulder and upper arm, subsequent encounter: Secondary | ICD-10-CM | POA: Diagnosis not present

## 2014-03-09 DIAGNOSIS — M25511 Pain in right shoulder: Secondary | ICD-10-CM | POA: Diagnosis present

## 2014-03-09 MED ORDER — IBUPROFEN 800 MG PO TABS
800.0000 mg | ORAL_TABLET | Freq: Once | ORAL | Status: AC
  Administered 2014-03-09: 800 mg via ORAL
  Filled 2014-03-09: qty 1

## 2014-03-09 MED ORDER — OXYCODONE-ACETAMINOPHEN 5-325 MG PO TABS
2.0000 | ORAL_TABLET | Freq: Once | ORAL | Status: AC
  Administered 2014-03-09: 2 via ORAL
  Filled 2014-03-09: qty 2

## 2014-03-09 NOTE — ED Provider Notes (Signed)
CSN: [id number]     Arrival date & time 03/09/14  1657 History  This chart was scribed for Hilario Quarryanielle S Breland Trouten, MD by Julian HyMorgan Graham, ED Scribe. The patient was seen in [facility]. The patient's care was started at 8:02 PM.    Chief Complaint  Patient presents with  . Shoulder Pain   Patient is a 63 y.o. female presenting with shoulder pain. The history is provided by the patient. No language interpreter was used.  Shoulder Pain Location:  Shoulder Shoulder location:  R shoulder Pain details:    Severity:  Moderate   Timing:  Constant   Progression:  Worsening Chronicity:  New Dislocation: no   Foreign body present:  No foreign bodies Associated symptoms: neck pain   Associated symptoms: no fever    HPI Comments: Emma Ortiz is a 63 y.o. female who presents to the Emergency Department complaining of new, moderate, gradually worsening right shoulder onset 19 days. Pt describes her pain as throbbing. She notes her pain radiates to her neck. Pt has associated light-headedness and left shoulder pain. Pt reports she fell on 11/8 at work when she flipped over a table. Pt was seen on 11/21 for light-headedness. She previously had a contusion on her chest and received a x-Keyari Kleeman with normal findings. Pt notes she is unable to recall if she hit her head at the time of the fall. She notes she landed on her hands after her fall. She is unable to specify if she hit her chest on the table. She has taken tramadol and ibuprofen to attempt to alleviate her pain. She denies hx of HTN and denies taking medications regularly. Pt denies chest pain, SOB, nausea or vomiting.  PCP: Dr. Claretha Cooperoper    History reviewed. No pertinent past medical history. Past Surgical History  Procedure Laterality Date  . Tubal ligation    . Rotator cuff repair      left   No family history on file. History  Substance Use Topics  . Smoking status: Never Smoker   . Smokeless tobacco: Not on file  . Alcohol Use: No   OB History     No data available     Review of Systems  Constitutional: Negative for fever and chills.  Gastrointestinal: Negative for nausea and vomiting.  Musculoskeletal: Positive for arthralgias and neck pain.  Neurological: Positive for light-headedness.  All other systems reviewed and are negative.  Allergies  Review of patient's allergies indicates no known allergies.  Home Medications   Prior to Admission medications   Medication Sig Start Date End Date Taking? Authorizing Provider  meclizine (ANTIVERT) 50 MG tablet Take 1 tablet (50 mg total) by mouth 3 (three) times daily as needed for dizziness. 03/03/14   Fayrene HelperBowie Tran, PA-C  traMADol (ULTRAM) 50 MG tablet Take 1 tablet (50 mg total) by mouth every 6 (six) hours as needed. 03/03/14   Fayrene HelperBowie Tran, PA-C   Triage Vitals: BP 145/76 mmHg  Pulse 65  Temp(Src) 98.5 F (36.9 C) (Oral)  Resp 18  Ht 5\' 4"  (1.626 m)  Wt 220 lb (99.791 kg)  BMI 37.74 kg/m2  SpO2 100%  Physical Exam  Constitutional: She is oriented to person, place, and time. She appears well-developed and well-nourished.  HENT:  Head: Normocephalic and atraumatic.  Right Ear: External ear normal.  Left Ear: External ear normal.  Nose: Nose normal.  Mouth/Throat: Oropharynx is clear and moist.  Eyes: Conjunctivae and EOM are normal. Pupils are equal, round, and reactive to light.  Neck: Normal range of motion. Neck supple.  Cardiovascular: Normal rate, regular rhythm, normal heart sounds and intact distal pulses.   Pulmonary/Chest: Effort normal and breath sounds normal.  Abdominal: Soft. Bowel sounds are normal.  Musculoskeletal: Normal range of motion.  Decreased external rotation of right shoulder and decreased lateral raise of right shoulder. Good extension of right shoulder.   Neurological: She is alert and oriented to person, place, and time. She has normal reflexes.  Skin: Skin is warm and dry.  Psychiatric: She has a normal mood and affect. Her behavior is normal.  Judgment and thought content normal.  Nursing note and vitals reviewed.   ED Course  Procedures (including critical care time) DIAGNOSTIC STUDIES: Oxygen Saturation is 100% on RA, normal by my interpretation.    COORDINATION OF CARE: 8:10 PM- Patient informed of current plan for treatment and evaluation and agrees with plan at this time.  Imaging Review No results found.    MDM   Final diagnoses:  Right shoulder injury, subsequent encounter   I personally performed the services described in this documentation, which was scribed in my presence. The recorded information has been reviewed and considered.   Hilario Quarryanielle S Masiyah Engen, MD 03/17/14 [id number]

## 2014-03-09 NOTE — Discharge Instructions (Signed)
Shoulder Pain  The shoulder is the joint that connects your arm to your body. Muscles and band-like tissues that connect bones to muscles (tendons) hold the joint together. Shoulder pain is felt if an injury or medical problem affects one or more parts of the shoulder.  HOME CARE   · Put ice on the sore area.  ¨ Put ice in a plastic bag.  ¨ Place a towel between your skin and the bag.  ¨ Leave the ice on for 15-20 minutes, 03-04 times a day for the first 2 days.  · Stop using cold packs if they do not help with the pain.  · If you were given something to keep your shoulder from moving (sling; shoulder immobilizer), wear it as told. Only take it off to shower or bathe.  · Move your arm as little as possible, but keep your hand moving to prevent puffiness (swelling).  · Squeeze a soft ball or foam pad as much as possible to help prevent swelling.  · Take medicine as told by your doctor.  GET HELP IF:  · You have progressing new pain in your arm, hand, or fingers.  · Your hand or fingers get cold.  · Your medicine does not help lessen your pain.  GET HELP RIGHT AWAY IF:   · Your arm, hand, or fingers are numb or tingling.  · Your arm, hand, or fingers are puffy (swollen), painful, or turn white or blue.  MAKE SURE YOU:   · Understand these instructions.  · Will watch your condition.  · Will get help right away if you are not doing well or get worse.  Document Released: 09/16/2007 Document Revised: 08/14/2013 Document Reviewed: 10/12/2011  ExitCare® Patient Information ©2015 [employer]. This information is not intended to replace advice given to you by your health care provider. Make sure you discuss any questions you have with your health care provider.

## 2014-03-09 NOTE — ED Notes (Signed)
Daughter will be driving pt home.

## 2014-03-09 NOTE — ED Notes (Signed)
C/o cont'd pain to right shoulder and neck-fell 11/8-states was seen here for same-also c/o cont'd "light headed" cont'd-seen here 11/21 for same-pt with steady gait to triage

## 2014-03-15 ENCOUNTER — Ambulatory Visit (INDEPENDENT_AMBULATORY_CARE_PROVIDER_SITE_OTHER): Payer: 59 | Admitting: Family Medicine

## 2014-03-15 ENCOUNTER — Encounter: Payer: Self-pay | Admitting: Family Medicine

## 2014-03-15 VITALS — BP 132/105 | HR 89 | Ht 64.0 in | Wt 225.0 lb

## 2014-03-15 DIAGNOSIS — S99921A Unspecified injury of right foot, initial encounter: Secondary | ICD-10-CM

## 2014-03-15 DIAGNOSIS — M542 Cervicalgia: Secondary | ICD-10-CM

## 2014-03-15 DIAGNOSIS — S46011A Strain of muscle(s) and tendon(s) of the rotator cuff of right shoulder, initial encounter: Secondary | ICD-10-CM

## 2014-03-15 DIAGNOSIS — M25511 Pain in right shoulder: Secondary | ICD-10-CM

## 2014-03-15 MED ORDER — MELOXICAM 15 MG PO TABS: 15.0000 mg | ORAL_TABLET | Freq: Every day | ORAL | Status: DC

## 2014-03-15 MED ORDER — HYDROCODONE-ACETAMINOPHEN 5-325 MG PO TABS: 1.0000 | ORAL_TABLET | Freq: Four times a day (QID) | ORAL | Status: DC | PRN

## 2014-03-15 NOTE — Patient Instructions (Signed)
You have strained your rotator cuff and your neck muscles. Try to avoid painful activities (overhead activities, lifting with extended arm) as much as possible. Meloxicam 15 mg daily with food for pain and inflammation. Norco as needed for severe pain. Start physical therapy with transition to home exercise program. Do home exercise program with theraband and scapular stabilization exercises daily - these are very important for long term relief. If not improving at follow-up we will consider further imaging (MRI) of your shoulder. Follow up with me in 1 month to 6 weeks.

## 2014-03-16 NOTE — Progress Notes (Signed)
PCP: PROVIDER NOT IN SYSTEM  Subjective:   HPI: Patient is a 63 y.o. female here for right shoulder injury.  Patient reports at work on 11/8 she was pushing a table down the hallway when the table flipped forward and patient went over the table landing on her chest, more to right side. She went to the ED on 11/17 with pain in right foot, neck, back, and shoulder from the fall. Radiographs of right foot, shoulder, thoracic and cervical spines negative (spasms of cervical spine). She continues to struggle with 9/10 pain in shoulder, 8/10 in neck and has swelling and a knot right foot. No numbness or tingling. No bowel/bladder dysfunction. Denies prior issues with any of these areas. Taking tramadol and ibuprofen.  No past medical history on file.  Current Outpatient Prescriptions on File Prior to Visit  Medication Sig Dispense Refill  . meclizine (ANTIVERT) 50 MG tablet Take 1 tablet (50 mg total) by mouth 3 (three) times daily as needed for dizziness. 15 tablet 0   No current facility-administered medications on file prior to visit.    Past Surgical History  Procedure Laterality Date  . Tubal ligation    . Rotator cuff repair      left    No Known Allergies  History   Social History  . Marital Status: Single    Spouse Name: N/A    Number of Children: N/A  . Years of Education: N/A   Occupational History  . Not on file.   Social History Main Topics  . Smoking status: Never Smoker   . Smokeless tobacco: Not on file  . Alcohol Use: No  . Drug Use: No  . Sexual Activity: Not on file   Other Topics Concern  . Not on file   Social History Narrative    No family history on file.  BP 132/105 mmHg  Pulse 89  Ht 5\' 4"  (1.626 m)  Wt 225 lb (102.059 kg)  BMI 38.60 kg/m2  Review of Systems: See HPI above.    Objective:  Physical Exam:  Gen: NAD  Neck: No gross deformity, swelling, bruising. TTP bilateral paraspinal cervical muscles, less upper thoracic.   No midline/bony TTP. FROM neck - pain extension primarily. BUE strength 5/5.   Sensation intact to light touch.   2+ equal reflexes in triceps, biceps, trace brachioradialis tendons. Negative spurlings. NV intact distal BUEs.  R shoulder: No swelling, ecchymoses.  No gross deformity. No TTP. FROM with painful arc. Negative Hawkins, Neers. Negative Speeds, Yergasons. Strength 5/5 with empty can and resisted internal/external rotation.  Pain empty can. Negative apprehension. NV intact distally.  R foot: No gross deformity, swelling, ecchymoses FROM No TTP currently. Negative ant drawer and talar tilt.   Negative syndesmotic compression. Thompsons test negative. NV intact distally.    Assessment & Plan:  1. Neck pain - consistent with cervical strain.  Meloxicam, physical therapy and home exercises.  Norco as needed.  2. Right rotator cuff strain - mobic, physical therapy and home exercises.  Norco as needed.  Consider MRI if not improving as expected.  3. Right foot injury - no evidence of pathology currently.  Likely due to contusion vs mild sprain.

## 2014-03-19 DIAGNOSIS — S99921A Unspecified injury of right foot, initial encounter: Secondary | ICD-10-CM | POA: Insufficient documentation

## 2014-03-19 DIAGNOSIS — M542 Cervicalgia: Secondary | ICD-10-CM | POA: Insufficient documentation

## 2014-03-19 DIAGNOSIS — S46019A Strain of muscle(s) and tendon(s) of the rotator cuff of unspecified shoulder, initial encounter: Secondary | ICD-10-CM | POA: Insufficient documentation

## 2014-03-19 NOTE — Assessment & Plan Note (Signed)
consistent with cervical strain.  Meloxicam, physical therapy and home exercises.  Norco as needed.

## 2014-03-19 NOTE — Assessment & Plan Note (Signed)
no evidence of pathology currently.  Likely due to contusion vs mild sprain.

## 2014-03-19 NOTE — Assessment & Plan Note (Signed)
mobic, physical therapy and home exercises.  Norco as needed.  Consider MRI if not improving as expected.

## 2014-04-19 ENCOUNTER — Ambulatory Visit: Payer: 59 | Admitting: Family Medicine

## 2014-09-10 ENCOUNTER — Emergency Department (HOSPITAL_BASED_OUTPATIENT_CLINIC_OR_DEPARTMENT_OTHER)
Admission: EM | Admit: 2014-09-10 | Discharge: 2014-09-10 | Disposition: A | Discharge status: Home / Self Care | Payer: 59 | Attending: Emergency Medicine | Admitting: Emergency Medicine

## 2014-09-10 ENCOUNTER — Encounter (HOSPITAL_BASED_OUTPATIENT_CLINIC_OR_DEPARTMENT_OTHER): Payer: Self-pay

## 2014-09-10 DIAGNOSIS — Z87828 Personal history of other (healed) physical injury and trauma: Secondary | ICD-10-CM | POA: Insufficient documentation

## 2014-09-10 DIAGNOSIS — M25511 Pain in right shoulder: Secondary | ICD-10-CM | POA: Insufficient documentation

## 2014-09-10 DIAGNOSIS — M25472 Effusion, left ankle: Secondary | ICD-10-CM | POA: Insufficient documentation

## 2014-09-10 MED ORDER — NAPROXEN 500 MG PO TABS: 500.0000 mg | ORAL_TABLET | Freq: Two times a day (BID) | ORAL | Status: AC

## 2014-09-10 NOTE — ED Provider Notes (Signed)
CSN: [id number]     Arrival date & time 09/10/14  2147 History   First MD Initiated Contact with Patient 09/10/14 2212     Chief Complaint  Patient presents with  . Joint Swelling     (Consider location/radiation/quality/duration/timing/severity/associated sxs/prior Treatment) HPI Comments: 64 y/o F c/o R shoulder pain radiating from the front of her shoulder around to the right side of her neck that has been intermittent since a fall 6 months ago. Pain is a constant throb, worse with certain movements. Has not tried any alleviating factors. Saw Dr. Pearletha ForgeHudnall 5 months ago and was advised to do PT, however had insurance issues at the time and was unable to do so. No new injury or trauma. Denies numbness or tingling. No CP or SOB. Also c/o "fluid sack" lateral to her L ankle x 3 days. No injury or trauma. Denies any pain. No fevers. Tried elevating with no relief.  The history is provided by the patient.    History reviewed. No pertinent past medical history. Past Surgical History  Procedure Laterality Date  . Tubal ligation    . Rotator cuff repair      left   No family history on file. History  Substance Use Topics  . Smoking status: Never Smoker   . Smokeless tobacco: Not on file  . Alcohol Use: No   OB History    No data available     Review of Systems  Musculoskeletal:       + R shoulder pain. + L ankle swelling.  All other systems reviewed and are negative.     Allergies  Codeine  Home Medications   Prior to Admission medications   Medication Sig Start Date End Date Taking? Authorizing Provider  naproxen (NAPROSYN) 500 MG tablet Take 1 tablet (500 mg total) by mouth 2 (two) times daily. 09/10/14   Fynn Vanblarcom M Chuong Casebeer, PA-C   BP 162/92 mmHg  Pulse 76  Temp(Src) 98.3 F (36.8 C) (Oral)  Resp 18  Ht 5\' 4"  (1.626 m)  Wt 220 lb (99.791 kg)  BMI 37.74 kg/m2  SpO2 100% Physical Exam  Constitutional: She is oriented to person, place, and time. She appears well-developed  and well-nourished. No distress.  Obese.  HENT:  Head: Normocephalic and atraumatic.  Mouth/Throat: Oropharynx is clear and moist.  Eyes: Conjunctivae and EOM are normal.  Neck: Normal range of motion. Neck supple.  Cardiovascular: Normal rate, regular rhythm and normal heart sounds.   Pulses:      Radial pulses are 2+ on the right side, and 2+ on the left side.       Dorsalis pedis pulses are 2+ on the right side, and 2+ on the left side.       Posterior tibial pulses are 2+ on the right side, and 2+ on the left side.  Pulmonary/Chest: Effort normal and breath sounds normal. No respiratory distress.  Musculoskeletal: Normal range of motion.  R shoulder TTP anterior, pain increased with shoulder flexion and abduction. Crepitus noted. No swelling or deformity. L ankle non-tender, with mild, non-pitting edema around lateral malleolus. No erythema or warmth. FROM without pain.  Neurological: She is alert and oriented to person, place, and time. No sensory deficit.  Skin: Skin is warm and dry.  Psychiatric: She has a normal mood and affect. Her behavior is normal.  Nursing note and vitals reviewed.   ED Course  Procedures (including critical care time) Labs Review Labs Reviewed - No data to display  Imaging Review No results found.   EKG Interpretation None      MDM   Final diagnoses:  Right shoulder pain  Left ankle swelling   NAD. Neurovascularly intact. Shoulder pain ongoing for 6 months. Advised ice and NSAIDs, and to f/u with Dr. Pearletha Forge as she may need to go through PT as advised in the past. Regarding ankle swelling, there is no tenderness, erythema or warmth. No pitting edema. FROM. Advised pt to elevate and f/u with her PCP or Dr. Pearletha Forge. Stable for d/c. Return precautions given. Patient states understanding of treatment care plan and is agreeable.  Kathrynn Speed, PA-C 09/10/14 2239  Margarita Grizzle, MD 09/10/14 [phone]

## 2014-09-10 NOTE — ED Notes (Signed)
C/o swelling to right ankle x 2 days-denies injury-steady gait

## 2014-09-10 NOTE — ED Notes (Signed)
C/o neck and shoulder pain since a fall in November,  Also wants md to look at "fluid sack" on each ankle

## 2014-09-10 NOTE — Discharge Instructions (Signed)
Take naproxen as prescribed. Ice your shoulder. Ice and elevate your left ankle. If the swelling remains within the next few days, follow-up with your primary care physician. You may also discuss this with Dr. Pearletha ForgeHudnall.  Shoulder Pain The shoulder is the joint that connects your arms to your body. The bones that form the shoulder joint include the upper arm bone (humerus), the shoulder blade (scapula), and the collarbone (clavicle). The top of the humerus is shaped like a ball and fits into a rather flat socket on the scapula (glenoid cavity). A combination of muscles and strong, fibrous tissues that connect muscles to bones (tendons) support your shoulder joint and hold the ball in the socket. Small, fluid-filled sacs (bursae) are located in different areas of the joint. They act as cushions between the bones and the overlying soft tissues and help reduce friction between the gliding tendons and the bone as you move your arm. Your shoulder joint allows a wide range of motion in your arm. This range of motion allows you to do things like scratch your back or throw a ball. However, this range of motion also makes your shoulder more prone to pain from overuse and injury. Causes of shoulder pain can originate from both injury and overuse and usually can be grouped in the following four categories:  Redness, swelling, and pain (inflammation) of the tendon (tendinitis) or the bursae (bursitis).  Instability, such as a dislocation of the joint.  Inflammation of the joint (arthritis).  Broken bone (fracture). HOME CARE INSTRUCTIONS   Apply ice to the sore area.  Put ice in a plastic bag.  Place a towel between your skin and the bag.  Leave the ice on for 15-20 minutes, 3-4 times per day for the first 2 days, or as directed by your health care provider.  Stop using cold packs if they do not help with the pain.  If you have a shoulder sling or immobilizer, wear it as long as your caregiver instructs.  Only remove it to shower or bathe. Move your arm as little as possible, but keep your hand moving to prevent swelling.  Squeeze a soft ball or foam pad as much as possible to help prevent swelling.  Only take over-the-counter or prescription medicines for pain, discomfort, or fever as directed by your caregiver. SEEK MEDICAL CARE IF:   Your shoulder pain increases, or new pain develops in your arm, hand, or fingers.  Your hand or fingers become cold and numb.  Your pain is not relieved with medicines. SEEK IMMEDIATE MEDICAL CARE IF:   Your arm, hand, or fingers are numb or tingling.  Your arm, hand, or fingers are significantly swollen or turn white or blue. MAKE SURE YOU:   Understand these instructions.  Will watch your condition.  Will get help right away if you are not doing well or get worse. Document Released: 01/07/2005 Document Revised: 08/14/2013 Document Reviewed: 03/14/2011 [facility] Patient Information 2015 [employer], [place]. This information is not intended to replace advice given to you by your health care provider. Make sure you discuss any questions you have with your health care provider.

## 2015-12-30 ENCOUNTER — Encounter (HOSPITAL_BASED_OUTPATIENT_CLINIC_OR_DEPARTMENT_OTHER): Payer: Self-pay | Admitting: Emergency Medicine

## 2015-12-30 ENCOUNTER — Emergency Department (HOSPITAL_BASED_OUTPATIENT_CLINIC_OR_DEPARTMENT_OTHER): Payer: Self-pay

## 2015-12-30 ENCOUNTER — Emergency Department (HOSPITAL_BASED_OUTPATIENT_CLINIC_OR_DEPARTMENT_OTHER)
Admission: EM | Admit: 2015-12-30 | Discharge: 2015-12-30 | Disposition: A | Discharge status: Home / Self Care | Payer: Self-pay | Attending: Emergency Medicine | Admitting: Emergency Medicine

## 2015-12-30 DIAGNOSIS — Z79899 Other long term (current) drug therapy: Secondary | ICD-10-CM | POA: Insufficient documentation

## 2015-12-30 DIAGNOSIS — I10 Essential (primary) hypertension: Secondary | ICD-10-CM | POA: Insufficient documentation

## 2015-12-30 DIAGNOSIS — M25561 Pain in right knee: Secondary | ICD-10-CM | POA: Insufficient documentation

## 2015-12-30 HISTORY — DX: Essential (primary) hypertension: I10

## 2015-12-30 NOTE — ED Notes (Signed)
Ace wrap applied by tech on right knee due to knee sleeve not fitting well. Patient A&Ox4 and in NAD. Patient is ambulatory at discharge and denies any needs or further questions.

## 2015-12-30 NOTE — ED Triage Notes (Signed)
Patient states that she has pain to her right knee, the patient reports it is worse when she gets ready to stand up, but it is a 10/10 while sitting

## 2015-12-30 NOTE — Discharge Instructions (Signed)
Please read and follow all provided instructions.  Your diagnoses today include:  1. Right knee pain    Tests performed today include: Vital signs. See below for your results today.   Medications prescribed:  Take as prescribed   Home care instructions:  Follow any educational materials contained in this packet.  Follow-up instructions: Please follow-up with your primary care provider for further evaluation of symptoms and treatment   Return instructions:  Please return to the Emergency Department if you do not get better, if you get worse, or new symptoms OR  - Fever (temperature greater than 101.66F)  - Bleeding that does not stop with holding pressure to the area    -Severe pain (please note that you may be more sore the day after your accident)  - Chest Pain  - Difficulty breathing  - Severe nausea or vomiting  - Inability to tolerate food and liquids  - Passing out  - Skin becoming red around your wounds  - Change in mental status (confusion or lethargy)  - New numbness or weakness    Please return if you have any other emergent concerns.  Additional Information:  Your vital signs today were: BP (!) 153/124 (BP Location: Right Arm)    Pulse 65    Temp 98.5 F (36.9 C) (Oral)    Resp 18    Ht 5\' 5"  (1.651 m)    Wt 104.3 kg    SpO2 100%    BMI 38.27 kg/m  If your blood pressure (BP) was elevated above 135/85 this visit, please have this repeated by your doctor within one month. ---------------

## 2015-12-30 NOTE — ED Provider Notes (Signed)
[facility]   CSN: [id number] Arrival date & time: 12/30/15  1620 By signing my name below, I, Bridgette HabermannMaria Tan, attest that this documentation has been prepared under the direction and in the presence of Audry Piliyler Gracyn Santillanes, PA-C. Electronically Signed: Bridgette HabermannMaria Tan, ED Scribe. 12/30/15. 6:37 PM.  History   Chief Complaint Chief Complaint  Patient presents with  . Knee Pain   HPI Comments: Emma Ortiz is a 65 y.o. female who presents to the Emergency Department complaining of constant, 10/10, aching right knee pain onset two weeks ago. Pt denies any recent injury or trauma. She states pain is exacerbated when she is getting ready to stand up and bearing weight. Pt took Tylenol with no relief to her pain. She notes h/o injury to the knee but reports it was resolved. Pt denies fever, numbness, paresthesia, or any other associated symptoms.  The history is provided by the patient. No language interpreter was used.    Past Medical History:  Diagnosis Date  . Hypertension     Patient Active Problem List   Diagnosis Date Noted  . Neck pain 03/19/2014  . Rotator cuff strain 03/19/2014  . Right foot injury 03/19/2014    Past Surgical History:  Procedure Laterality Date  . ROTATOR CUFF REPAIR     left  . TUBAL LIGATION      OB History    No data available       Home Medications    Prior to Admission medications   Medication Sig Start Date End Date Taking? Authorizing Provider  hydrochlorothiazide (HYDRODIURIL) 25 MG tablet Take 25 mg by mouth daily.   Yes Historical Provider, MD  naproxen (NAPROSYN) 500 MG tablet Take 1 tablet (500 mg total) by mouth 2 (two) times daily. 09/10/14   Kathrynn Speedobyn M Hess, PA-C    Family History History reviewed. No pertinent family history.  Social History Social History  Substance Use Topics  . Smoking status: Never Smoker  . Smokeless tobacco: Never Used  . Alcohol use No     Allergies   Codeine   Review of  Systems Review of Systems  Constitutional: Negative for fever.  Musculoskeletal: Positive for arthralgias.  Neurological: Negative for numbness.  All other systems reviewed and are negative.  Physical Exam Updated Vital Signs BP (!) 153/124 (BP Location: Right Arm)   Pulse 65   Temp 98.5 F (36.9 C) (Oral)   Resp 18   Ht 5\' 5"  (1.651 m)   Wt 230 lb (104.3 kg)   SpO2 100%   BMI 38.27 kg/m   Physical Exam  Constitutional: She appears well-developed and well-nourished.  HENT:  Head: Normocephalic.  Eyes: Conjunctivae are normal.  Cardiovascular: Normal rate.   Pulmonary/Chest: Effort normal. No respiratory distress.  Abdominal: She exhibits no distension.  Musculoskeletal: Normal range of motion. She exhibits tenderness.  Tenderness to palpation along the lateral aspect of knee joint. Neurovascularly intact, motor sensation intact, cap refill < 2 seconds.  Neurological: She is alert.  Skin: Skin is warm and dry.  Psychiatric: She has a normal mood and affect. Her behavior is normal.  Nursing note and vitals reviewed.  ED Treatments / Results  DIAGNOSTIC STUDIES: Oxygen Saturation is 100% on RA, normal by my interpretation.    COORDINATION OF CARE: 6:35 PM Discussed treatment plan with pt at bedside which includes knee brace and orthopedic referral and pt agreed to plan.  Labs (all labs ordered are listed, but only abnormal results are displayed) Labs Reviewed -  No data to display  EKG  EKG Interpretation None       Radiology Dg Knee Complete 4 Views Right  Result Date: 12/30/2015 CLINICAL DATA:  Anterior right knee pain, fell several months ago, no acute trauma EXAM: RIGHT KNEE - COMPLETE 4+ VIEW COMPARISON:  Right knee films of 03/14/2015 FINDINGS: Slight loss of joint space is again noted involving the lateral compartment. The medial and patellofemoral compartments are relatively well preserved. There is faint chondrocalcinosis which could indicate CPPD  arthropathy. No acute fracture is seen and no joint effusion is noted. 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 Electronically Signed   By: Dwyane Dee M.D.   On: 12/30/2015 16:53    Procedures Procedures (including critical care time)  Medications Ordered in ED Medications - No data to display   Initial Impression / Assessment and Plan / ED Course  I have reviewed the triage vital signs and the nursing notes.  Pertinent labs & imaging results that were available during my care of the patient were reviewed by me and considered in my medical decision making (see chart for details).  Clinical Course    Final Clinical Impressions(s) / ED Diagnoses  I have reviewed and evaluated the relevant imaging studies.  I have reviewed the relevant previous healthcare records. I obtained HPI from historian.  ED Course:  Assessment: Pt is a 65yF who presents with right knee pain x 2 weeks. No trauma to area. On exam, pt in NAD. Nontoxic/nonseptic appearing. VSS. Afebrile. Right knee NVI. Distal pulses appreciated with ROM intact. Imaging shows degenerative changes on lateral aspect.. Given knee sleeve in ED. Plan is to DC home with follow up to Ortho. At time of discharge, Patient is in no acute distress. Vital Signs are stable. Patient is able to ambulate. Patient able to tolerate PO.    Disposition/Plan:  DC Home Additional Verbal discharge instructions given and discussed with patient.  Pt Instructed to f/u with Ortho in the next week for evaluation and treatment of symptoms. Return precautions given Pt acknowledges and agrees with plan  Supervising Physician Lavera Guise, MD  Final diagnoses:  Right knee pain   I personally performed the services described in this documentation, which was scribed in my presence. The recorded information has been reviewed and is accurate.   New  Prescriptions New Prescriptions   No medications on file     Audry Pili, PA-C 12/30/15 1843    Lavera Guise, MD 12/31/15 1228

## 2016-02-18 ENCOUNTER — Emergency Department (HOSPITAL_BASED_OUTPATIENT_CLINIC_OR_DEPARTMENT_OTHER)
Admission: EM | Admit: 2016-02-18 | Discharge: 2016-02-18 | Disposition: A | Discharge status: Home / Self Care | Payer: No Typology Code available for payment source | Attending: Emergency Medicine | Admitting: Emergency Medicine

## 2016-02-18 ENCOUNTER — Encounter (HOSPITAL_BASED_OUTPATIENT_CLINIC_OR_DEPARTMENT_OTHER): Payer: Self-pay

## 2016-02-18 DIAGNOSIS — I10 Essential (primary) hypertension: Secondary | ICD-10-CM | POA: Insufficient documentation

## 2016-02-18 DIAGNOSIS — Y999 Unspecified external cause status: Secondary | ICD-10-CM | POA: Diagnosis not present

## 2016-02-18 DIAGNOSIS — S161XXA Strain of muscle, fascia and tendon at neck level, initial encounter: Secondary | ICD-10-CM | POA: Insufficient documentation

## 2016-02-18 DIAGNOSIS — Y92410 Unspecified street and highway as the place of occurrence of the external cause: Secondary | ICD-10-CM | POA: Diagnosis not present

## 2016-02-18 DIAGNOSIS — Y9389 Activity, other specified: Secondary | ICD-10-CM | POA: Insufficient documentation

## 2016-02-18 DIAGNOSIS — Z79899 Other long term (current) drug therapy: Secondary | ICD-10-CM | POA: Diagnosis not present

## 2016-02-18 DIAGNOSIS — S199XXA Unspecified injury of neck, initial encounter: Secondary | ICD-10-CM | POA: Diagnosis present

## 2016-02-18 MED ORDER — CYCLOBENZAPRINE HCL 5 MG PO TABS: 10.0000 mg | ORAL_TABLET | Freq: Three times a day (TID) | ORAL | 0 refills | Status: DC | PRN

## 2016-02-18 NOTE — ED Notes (Signed)
Pt. Walked steady gait to room 1 from triage
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
## 2016-02-18 NOTE — ED Notes (Signed)
ED Provider at bedside. 

## 2016-02-18 NOTE — ED Notes (Signed)
No visual disturbances and no dizziness per Pt.

## 2016-09-13 ENCOUNTER — Encounter (HOSPITAL_BASED_OUTPATIENT_CLINIC_OR_DEPARTMENT_OTHER): Payer: Self-pay | Admitting: *Deleted

## 2016-09-13 ENCOUNTER — Emergency Department (HOSPITAL_BASED_OUTPATIENT_CLINIC_OR_DEPARTMENT_OTHER)
Admission: EM | Admit: 2016-09-13 | Discharge: 2016-09-13 | Disposition: A | Discharge status: Home / Self Care | Payer: Medicare Other | Attending: Emergency Medicine | Admitting: Emergency Medicine

## 2016-09-13 DIAGNOSIS — I10 Essential (primary) hypertension: Secondary | ICD-10-CM | POA: Diagnosis not present

## 2016-09-13 DIAGNOSIS — J029 Acute pharyngitis, unspecified: Secondary | ICD-10-CM | POA: Insufficient documentation

## 2016-09-13 DIAGNOSIS — Z79899 Other long term (current) drug therapy: Secondary | ICD-10-CM | POA: Insufficient documentation

## 2016-09-13 LAB — RAPID STREP SCREEN (MED CTR MEBANE ONLY): STREPTOCOCCUS, GROUP A SCREEN (DIRECT): NEGATIVE

## 2016-09-13 NOTE — Discharge Instructions (Signed)
Return to the ED with any concerns including difficulty breathing or swallowing, vomiting and not able to keep down liquids, decreased level of alertness/lethargy, or any other alarming symptoms °

## 2016-09-13 NOTE — ED Triage Notes (Signed)
Patient states she has a three day history of sore throat.  Denies fever.  Has had a family member with recent strept.

## 2016-09-13 NOTE — ED Notes (Signed)
ED Provider at bedside. 

## 2016-09-13 NOTE — ED Provider Notes (Signed)
[facility]   CSN: [id number] Arrival date & time: 09/13/16  1009     History   Chief Complaint Chief Complaint  Patient presents with  . Sore Throat    HPI Emma Ortiz is a 66 y.o. female.  HPI  Pt presenting with c/o sore throat.  She states her throat has been hurting for the past 3 days.  No fever/chills.  No nasal congestion.  No cough or difficulty breathing or swallowing.  She tried using a salt water gargle on her throat without much help.  Her grandchild had strep throat - she saw them approx 1 week ago- they do not live with her- she was in the same car with them for a short amount of time.  There are no other associated systemic symptoms, there are no other alleviating or modifying factors. She also has some mild pain that radiates to her right ear.    Past Medical History:  Diagnosis Date  . Hypertension     Patient Active Problem List   Diagnosis Date Noted  . Neck pain 03/19/2014  . Rotator cuff strain 03/19/2014  . Right foot injury 03/19/2014    Past Surgical History:  Procedure Laterality Date  . ROTATOR CUFF REPAIR     left  . TUBAL LIGATION      OB History    No data available       Home Medications    Prior to Admission medications   Medication Sig Start Date End Date Taking? Authorizing Provider  amLODipine (NORVASC) 10 MG tablet Take 25 mg by mouth daily.   Yes [provider]  hydrochlorothiazide (HYDRODIURIL) 25 MG tablet Take 25 mg by mouth daily.   Yes [provider]  Methocarbamol (ROBAXIN PO) Take by mouth.   Yes [provider]  cyclobenzaprine (FLEXERIL) 5 MG tablet Take 2 tablets (10 mg total) by mouth 3 (three) times daily as needed for muscle spasms. 02/18/16   Beaulah Dinning, MD  naproxen (NAPROSYN) 500 MG tablet Take 1 tablet (500 mg total) by mouth 2 (two) times daily. 09/10/14   Hess, Nada Boozer, PA-C    Family History No family history on file.  Social  History Social History  Substance Use Topics  . Smoking status: Never Smoker  . Smokeless tobacco: Never Used  . Alcohol use No     Allergies   Codeine   Review of Systems Review of Systems  ROS reviewed and all otherwise negative except for mentioned in HPI   Physical Exam Updated Vital Signs BP 139/72 (BP Location: Right Arm)   Pulse 71   Temp 99.2 F (37.3 C) (Oral)   Resp 14   Ht 5\' 5"  (1.651 m)   Wt 106.6 kg (235 lb)   SpO2 100%   BMI 39.11 kg/m  Vitals reviewed Physical Exam Physical Examination: General appearance - alert, well appearing, and in no distress Mental status - alert, oriented to person, place, and time Eyes - no conjunctival injection, no scleral icterus Mouth - mucous membranes moist, pharynx normal without lesions, mild erythema of OP, no exudate, palate symmetric, uvula midline Neck - supple, no significant adenopathy Chest - clear to auscultation, no wheezes, rales or rhonchi, symmetric air entry Heart - normal rate, regular rhythm, normal S1, S2, no murmurs, rubs, clicks or gallops Neurological - alert, oriented, normal speech, no focal findings or movement disorder noted Extremities - peripheral pulses normal, no pedal edema, no clubbing or cyanosis Skin -  normal coloration and turgor, no rashes  ED Treatments / Results  Labs (all labs ordered are listed, but only abnormal results are displayed) Labs Reviewed  RAPID STREP SCREEN (NOT AT [facility])  CULTURE, GROUP A STREP [facility]    EKG  EKG Interpretation None       Radiology No results found.  Procedures Procedures (including critical care time)  Medications Ordered in ED Medications - No data to display   Initial Impression / Assessment and Plan / ED Course  I have reviewed the triage vital signs and the nursing notes.  Pertinent labs & imaging results that were available during my care of the patient were reviewed by me and considered in my medical decision making (see  chart for details).     Pt presenting with c/o sore throat.  Rapid strep is negative.  No signs of PTA, RPA or other acute emergent condition at this time.   Patient is overall nontoxic and well hydrated in appearance.  No difficulty with swallowing or breathing.  Advised symptomatic treatment. Throat culture pending Discharged with strict return precautions.  Pt agreeable with plan.   Final Clinical Impressions(s) / ED Diagnoses   Final diagnoses:  Viral pharyngitis    New Prescriptions Discharge Medication List as of 09/13/2016 11:51 AM       Jerelyn ScottLinker, Nikyla Navedo, MD 09/17/16 [id number]

## 2016-09-16 LAB — CULTURE, GROUP A STREP (THRC)

## 2017-11-23 ENCOUNTER — Encounter: Payer: Self-pay | Admitting: Physical Therapy

## 2017-11-23 ENCOUNTER — Ambulatory Visit: Payer: Medicare Other | Attending: Family Medicine | Admitting: Physical Therapy

## 2017-11-23 ENCOUNTER — Other Ambulatory Visit: Payer: Self-pay

## 2017-11-23 ENCOUNTER — Ambulatory Visit: Payer: Medicare Other | Admitting: Physical Therapy

## 2017-11-23 DIAGNOSIS — M25611 Stiffness of right shoulder, not elsewhere classified: Secondary | ICD-10-CM | POA: Insufficient documentation

## 2017-11-23 DIAGNOSIS — M25512 Pain in left shoulder: Secondary | ICD-10-CM

## 2017-11-23 DIAGNOSIS — R252 Cramp and spasm: Secondary | ICD-10-CM | POA: Diagnosis present

## 2017-11-23 DIAGNOSIS — M542 Cervicalgia: Secondary | ICD-10-CM | POA: Diagnosis present

## 2017-11-23 DIAGNOSIS — M25612 Stiffness of left shoulder, not elsewhere classified: Secondary | ICD-10-CM | POA: Insufficient documentation

## 2017-11-23 DIAGNOSIS — M25511 Pain in right shoulder: Secondary | ICD-10-CM | POA: Diagnosis present

## 2017-11-23 NOTE — Therapy (Signed)
[facility] [address] [place], [place], [address] Phone: [phone]   Fax:  [phone]  Physical Therapy Evaluation  Patient Details  Name: Emma Ortiz MRN: [id number] Date of Birth: June 03, 1950 Referring Provider: Junius CreamerL Boyd   Encounter Date: 11/23/2017  PT End of Session - 11/23/17 1023    Visit Number  1    Date for PT Re-Evaluation  01/23/18    PT Start Time  1000    PT Stop Time  1045    PT Time Calculation (min)  45 min    Activity Tolerance  Patient tolerated treatment well    Behavior During Therapy  [facility] for tasks assessed/performed       Past Medical History:  Diagnosis Date  . Hypertension     Past Surgical History:  Procedure Laterality Date  . ROTATOR CUFF REPAIR     left  . TUBAL LIGATION      There were no vitals filed for this visit.   Subjective Assessment - 11/23/17 1004    Subjective  Patient reports that she has had shoulder and back pain since March.  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  X-rays show some arthritic changes.      Pertinent History  reports RC repairs on both shoulders on in 2006 and one in 2015    Limitations  Lifting;House hold activities    Patient Stated Goals  have less pain    Currently in Pain?  Yes    Pain Score  9     Pain Location  Shoulder    Pain Orientation  Left;Right    Pain Descriptors / Indicators  Sore;Aching;Sharp    Pain Type  Acute pain    Pain Radiating Towards  denies    Pain Onset  More than a month ago    Pain Frequency  Constant    Aggravating Factors   movements, lifting, doing hair all will increase pain to 10/10    Pain Relieving Factors  a gel that "I rub on" at best a pain a 3-4/10    Effect of Pain on Daily Activities  difficulty doing hair, reaching and lifting         [facility] PT Assessment - 11/23/17 0001      Assessment   Medical Diagnosis  shoulder pain and upper back pain    Referring Provider  TL Leavy CellaBoyd    Onset Date/Surgical Date  10/23/17    Hand Dominance  Right    Prior Therapy  reports that she had some PT for about a week in April      Precautions   Precautions  None      Balance Screen   Has the patient fallen in the past 6 months  No    Has the patient had a decrease in activity level because of a fear of falling?   No    Is the patient reluctant to leave their home because of a fear of falling?   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, some cooking and cleaning    Leisure  no exercise      Posture/Postural Control   Posture Comments  fwd head, rounded shoulder      ROM / Strength   AROM / PROM / Strength  AROM;Strength      AROM   Overall AROM Comments  cervical ROM is decreased 50% with c/o pain and tightness    AROM Assessment Site  Shoulder    Right/Left Shoulder  Right;Left    Right Shoulder Flexion  50 Degrees    Right Shoulder ABduction  50 Degrees    Right Shoulder Internal Rotation  15 Degrees    Right Shoulder External Rotation  40 Degrees    Left Shoulder Flexion  70 Degrees    Left Shoulder ABduction  70 Degrees    Left Shoulder Internal Rotation  20 Degrees    Left Shoulder External Rotation  40 Degrees      Strength   Overall Strength Comments  3-/5 with c/o pain difficulty with any resistance and movements      Palpation   Palpation comment  patient with significant spasms in the upper traps, the cervical p-spinals and the rhomboids, she is very tender                Objective measurements completed on examination: See above findings.      [facility] Adult PT Treatment/Exercise - 11/23/17 0001      Modalities   Modalities  Electrical Stimulation;Moist Heat      Moist Heat Therapy   Number Minutes Moist Heat  15 Minutes    Moist Heat Location   Cervical      Electrical Stimulation   Electrical Stimulation Location  cervical area into the shoulders    Electrical Stimulation Action  IFC    Electrical Stimulation Parameters  sitting    Electrical Stimulation Goals  Pain             PT Education - 11/23/17 1023    Education Details  gentle cervical shrugs and retractions, scapular retraction and some AAROM for the shoulders    Person(s) Educated  Patient    Methods  Explanation;Demonstration;Handout    Comprehension  Verbalized understanding       PT Short Term Goals - 11/23/17 1027      PT SHORT TERM GOAL #1   Title  independent with initial HEP    Time  3    Period  Weeks    Status  New        PT Long Term Goals - 11/23/17 1027      PT LONG TERM GOAL #1   Title  understand proper posture and body mechanics for her job and ADL's    Time  8    Period  Weeks    Status  New      PT LONG TERM GOAL #2   Title  decrease pain 50%    Time  8    Period  Weeks    Status  New      PT LONG TERM GOAL #3   Title  increase cervcial ROM 25%    Time  8    Period  Weeks    Status  New      PT LONG TERM GOAL #4   Title  increase bilateral shoulder ROM to 100 degrees flexion    Time  8    Period  Weeks    Status  New      PT LONG TERM GOAL #5   Title  report able to do hair most of the time without help  Time  8    Period  Weeks    Status  New             Plan - 11/23/17 1025    Clinical Impression Statement  Patient reports bilateral shoulder pain and neck/upper back pain since March, she is unsure of a specific cause except 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  She has some arthritic changes seen on x-ray.  Her ROM is very limited for shoulders, difficulty doing hair and dressing.  she has significant spasms in the upper traps and in the cervical and rhomboid areas    Clinical Presentation  Stable    Clinical Decision Making  Low    Rehab Potential  Good    PT Frequency  2x / week    PT  Duration  8 weeks    PT Treatment/Interventions  ADLs/Self Care Home Management;Electrical Stimulation;Cryotherapy;Moist Heat;Iontophoresis 4mg /ml Dexamethasone;Neuromuscular re-education;Therapeutic exercise;Therapeutic activities;Patient/family education;Manual techniques;Traction    PT Next Visit Plan  slowly work on ROM and functional activities trying to help with pain    Consulted and Agree with Plan of Care  Patient       Patient will benefit from skilled therapeutic intervention in order to improve the following deficits and impairments:  Decreased range of motion, Increased muscle spasms, Impaired UE functional use, Pain, Decreased activity tolerance, Improper body mechanics, Postural dysfunction, Decreased strength  Visit Diagnosis: Acute pain of right shoulder - Plan: PT plan of care cert/re-cert  Acute pain of left shoulder - Plan: PT plan of care cert/re-cert  Stiffness of right shoulder, not elsewhere classified - Plan: PT plan of care cert/re-cert  Stiffness of left shoulder, not elsewhere classified - Plan: PT plan of care cert/re-cert  Cervicalgia - Plan: PT plan of care cert/re-cert  Cramp and spasm - Plan: PT plan of care cert/re-cert     Problem List Patient Active Problem List   Diagnosis Date Noted  . Neck pain 03/19/2014  . Rotator cuff strain 03/19/2014  . Right foot injury 03/19/2014    Jearld Lesch., PT 11/23/2017, 10:31 AM  [facility] [address] [place], [place], [address] Phone: [phone]   Fax:  [phone]  Name: Emma Ortiz MRN: [id number] Date of Birth: Dec 13, 1950

## 2017-11-25 ENCOUNTER — Encounter: Payer: Self-pay | Admitting: Physical Therapy

## 2017-11-25 ENCOUNTER — Ambulatory Visit: Payer: Medicare Other | Admitting: Physical Therapy

## 2017-11-25 DIAGNOSIS — M25611 Stiffness of right shoulder, not elsewhere classified: Secondary | ICD-10-CM

## 2017-11-25 DIAGNOSIS — M25612 Stiffness of left shoulder, not elsewhere classified: Secondary | ICD-10-CM

## 2017-11-25 DIAGNOSIS — R252 Cramp and spasm: Secondary | ICD-10-CM

## 2017-11-25 DIAGNOSIS — M25511 Pain in right shoulder: Secondary | ICD-10-CM

## 2017-11-25 DIAGNOSIS — M25512 Pain in left shoulder: Secondary | ICD-10-CM

## 2017-11-25 DIAGNOSIS — M542 Cervicalgia: Secondary | ICD-10-CM

## 2017-11-25 NOTE — Therapy (Signed)
[facility] [address] [place], [place], [address] Phone: [phone]   Fax:  [phone]  Physical Therapy Treatment  Patient Details  Name: Emma Ortiz MRN: [id number] Date of Birth: [birth year] Referring Provider: Junius CreamerL Boyd   Encounter Date: 11/25/2017  PT End of Session - 11/25/17 0815    Visit Number  2    Date for PT Re-Evaluation  01/23/18    PT Start Time  0800    PT Stop Time  0845    PT Time Calculation (min)  45 min       Past Medical History:  Diagnosis Date  . Hypertension     Past Surgical History:  Procedure Laterality Date  . ROTATOR CUFF REPAIR     left  . TUBAL LIGATION      There were no vitals filed for this visit.  Subjective Assessment - 11/25/17 0759    Subjective  I am living. Doing ex at home. relief with estim/MH    Currently in Pain?  Yes    Pain Score  8     Pain Location  Shoulder    Pain Orientation  Right;Left                       [facility] Adult PT Treatment/Exercise - 11/25/17 0001      Exercises   Exercises  Neck;Shoulder      Neck Exercises: Standing   Neck Retraction  10 reps;3 secs   head on ball on wall   Other Standing Exercises  ball vs wall 5 x CC and CCW      Shoulder Exercises: Standing   Horizontal ABduction  Strengthening;Both;10 reps;Theraband    Theraband Level (Shoulder Horizontal ABduction)  Level 2 (Red)    External Rotation  Strengthening;10 reps;Theraband    Theraband Level (Shoulder External Rotation)  Level 2 (Red)    Extension  Strengthening;Both;10 reps;Theraband    Theraband Level (Shoulder Extension)  Level 2 (Red)    Row  Strengthening;Both;10 reps;Theraband    Theraband Level (Shoulder Row)  Level 2 (Red)    Other Standing Exercises  5# shruggs and backward rolls 10 each      Shoulder Exercises: ROM/Strengthening   UBE (Upper Arm Bike)  L 2 2 min fwd and 2 back      Modalities   Modalities  Electrical  Stimulation;Moist Heat      Moist Heat Therapy   Number Minutes Moist Heat  15 Minutes    Moist Heat Location  Cervical      Electrical Stimulation   Electrical Stimulation Location  cervical area into the shoulders    Electrical Stimulation Action  IFC    Electrical Stimulation Parameters  sitting    Electrical Stimulation Goals  Pain             PT Education - 11/25/17 0815    Education Details  reviewed HEP issued at eval - cuing to keep shld down as she over activates traps    Person(s) Educated  Patient    Methods  Verbal cues    Comprehension  Verbalized understanding;Returned demonstration       PT Short Term Goals - 11/25/17 0823      PT SHORT TERM GOAL #1   Title  independent with initial HEP    Status  Achieved        PT Long Term Goals - 11/23/17 1027      PT  LONG TERM GOAL #1   Title  understand proper posture and body mechanics for her job and ADL's    Time  8    Period  Weeks    Status  New      PT LONG TERM GOAL #2   Title  decrease pain 50%    Time  8    Period  Weeks    Status  New      PT LONG TERM GOAL #3   Title  increase cervcial ROM 25%    Time  8    Period  Weeks    Status  New      PT LONG TERM GOAL #4   Title  increase bilateral shoulder ROM to 100 degrees flexion    Time  8    Period  Weeks    Status  New      PT LONG TERM GOAL #5   Title  report able to do hair most of the time without help    Time  8    Period  Weeks    Status  New            Plan - 11/25/17 0815    Clinical Impression Statement  pt tolerated initial ther ex progression well with verb and tactile cuing to decrease trap elevation and activation with all ex. pt able to demo initial HEP and di well after cuing to decrease traps    PT Treatment/Interventions  ADLs/Self Care Home Management;Electrical Stimulation;Cryotherapy;Moist Heat;Iontophoresis 4mg /ml Dexamethasone;Neuromuscular re-education;Therapeutic exercise;Therapeutic  activities;Patient/family education;Manual techniques;Traction    PT Next Visit Plan  slowly work on ROM and functional activities trying to help with pain       Patient will benefit from skilled therapeutic intervention in order to improve the following deficits and impairments:  Decreased range of motion, Increased muscle spasms, Impaired UE functional use, Pain, Decreased activity tolerance, Improper body mechanics, Postural dysfunction, Decreased strength  Visit Diagnosis: Acute pain of right shoulder  Acute pain of left shoulder  Stiffness of right shoulder, not elsewhere classified  Stiffness of left shoulder, not elsewhere classified  Cervicalgia  Cramp and spasm     Problem List Patient Active Problem List   Diagnosis Date Noted  . Neck pain 03/19/2014  . Rotator cuff strain 03/19/2014  . Right foot injury 03/19/2014    Amaranta Mehl,ANGIE PTA 11/25/2017, 8:24 AM  [facility] [address] [place], [place], [address] Phone: [phone]   Fax:  [phone]  Name: Emma Bakerevada Mcgourty MRN: [id number] Date of Birth: Oct 21, 1950

## 2017-11-30 ENCOUNTER — Ambulatory Visit: Payer: Medicare Other

## 2017-12-02 ENCOUNTER — Ambulatory Visit: Payer: Medicare Other | Admitting: Physical Therapy

## 2017-12-10 ENCOUNTER — Encounter: Payer: Self-pay | Admitting: Physical Therapy

## 2017-12-10 ENCOUNTER — Ambulatory Visit: Payer: Medicare Other | Admitting: Physical Therapy

## 2017-12-10 DIAGNOSIS — M25512 Pain in left shoulder: Secondary | ICD-10-CM

## 2017-12-10 DIAGNOSIS — M542 Cervicalgia: Secondary | ICD-10-CM

## 2017-12-10 DIAGNOSIS — M25611 Stiffness of right shoulder, not elsewhere classified: Secondary | ICD-10-CM

## 2017-12-10 DIAGNOSIS — M25511 Pain in right shoulder: Secondary | ICD-10-CM | POA: Diagnosis not present

## 2017-12-10 DIAGNOSIS — M25612 Stiffness of left shoulder, not elsewhere classified: Secondary | ICD-10-CM

## 2017-12-10 DIAGNOSIS — R252 Cramp and spasm: Secondary | ICD-10-CM

## 2017-12-10 NOTE — Therapy (Signed)
[facility] [address] [place], [place], [address] Phone: [phone]   Fax:  [phone]  Physical Therapy Treatment  Patient Details  Name: Emma Ortiz MRN: [id number] Date of Birth: 11-27-1950 Referring Provider: Junius Creamer   Encounter Date: 12/10/2017  PT End of Session - 12/10/17 0917    Visit Number  3    Date for PT Re-Evaluation  01/23/18    PT Start Time  0833    PT Stop Time  0932    PT Time Calculation (min)  59 min    Activity Tolerance  Patient tolerated treatment well    Behavior During Therapy  [facility] for tasks assessed/performed       Past Medical History:  Diagnosis Date  . Hypertension     Past Surgical History:  Procedure Laterality Date  . ROTATOR CUFF REPAIR     left  . TUBAL LIGATION      There were no vitals filed for this visit.  Subjective Assessment - 12/10/17 0841    Subjective  I have a bad headache today.  Just still hurting    Currently in Pain?  Yes    Pain Score  8     Pain Location  Shoulder    Pain Orientation  Right    Pain Relieving Factors  the heat and electrical stuff feels good                       [facility] Adult PT Treatment/Exercise - 12/10/17 0001      Exercises   Exercises  Shoulder      Shoulder Exercises: Supine   Other Supine Exercises  ce3rvical retraction head on ball      Shoulder Exercises: Standing   Horizontal ABduction  Strengthening;Both;10 reps;Theraband    Theraband Level (Shoulder Horizontal ABduction)  Level 2 (Red)    External Rotation  Strengthening;10 reps;Theraband    Theraband Level (Shoulder External Rotation)  Level 2 (Red)    Extension  Strengthening;Both;10 reps;Theraband    Theraband Level (Shoulder Extension)  Level 2 (Red)    Row  Strengthening;Both;10 reps;Theraband    Theraband Level (Shoulder Row)  Level 2 (Red)    Other Standing Exercises  5# shruggs and backward rolls 10 each      Shoulder Exercises:  ROM/Strengthening   UBE (Upper Arm Bike)  L 2 2 min fwd and 2 back    "W" Arms  10 reps      Shoulder Exercises: Lawyer  3 reps;10 seconds      Modalities   Modalities  Electrical Stimulation;Moist Heat      Moist Heat Therapy   Number Minutes Moist Heat  15 Minutes    Moist Heat Location  Cervical;Shoulder      Electrical Stimulation   Electrical Stimulation Location  cervical area into the shoulders    Electrical Stimulation Action  IFC    Electrical Stimulation Parameters  sitting    Electrical Stimulation Goals  Pain      Manual Therapy   Manual Therapy  Passive ROM;Soft tissue mobilization    Soft tissue mobilization  to the cervical area and the upper trpas    Passive ROM  PROM bilateral shoulders all motions to end range               PT Short Term Goals - 11/25/17 0823      PT SHORT TERM GOAL #1  Title  independent with initial HEP    Status  Achieved        PT Long Term Goals - 12/10/17 [id number]      PT LONG TERM GOAL #1   Title  understand proper posture and body mechanics for her job and ADL's    Status  On-going      PT LONG TERM GOAL #2   Title  decrease pain 50%    Status  On-going      PT LONG TERM GOAL #3   Title  increase cervcial ROM 25%    Status  On-going      PT LONG TERM GOAL #4   Title  increase bilateral shoulder ROM to 100 degrees flexion    Status  On-going      PT LONG TERM GOAL #5   Title  report able to do hair most of the time without help    Status  On-going            Plan - 12/10/17 0917    Clinical Impression Statement  Patient with c/o neck and HA type pain today.  She was travelling last week and reports that she did okay but a little more stress with this and she has significant spasms in the upper traps and the cervical area    PT Next Visit Plan  slowly work on ROM and functional activities trying to help with pain    Consulted and Agree with Plan of Care  Patient       Patient will  benefit from skilled therapeutic intervention in order to improve the following deficits and impairments:  Decreased range of motion, Increased muscle spasms, Impaired UE functional use, Pain, Decreased activity tolerance, Improper body mechanics, Postural dysfunction, Decreased strength  Visit Diagnosis: Acute pain of right shoulder  Acute pain of left shoulder  Stiffness of right shoulder, not elsewhere classified  Stiffness of left shoulder, not elsewhere classified  Cervicalgia  Cramp and spasm     Problem List Patient Active Problem List   Diagnosis Date Noted  . Neck pain 03/19/2014  . Rotator cuff strain 03/19/2014  . Right foot injury 03/19/2014    Jearld LeschALBRIGHT,Aleria Maheu W., PT 12/10/2017, 9:19 AM  [facility] [address] [place], [place], [address] Phone: [phone]   Fax:  [phone]  Name: Emma Ortiz MRN: [id number] Date of Birth: 02-Jan-1951

## 2017-12-15 ENCOUNTER — Encounter: Payer: Self-pay | Admitting: Physical Therapy

## 2017-12-15 ENCOUNTER — Ambulatory Visit: Payer: Medicare Other | Attending: Family Medicine | Admitting: Physical Therapy

## 2017-12-15 DIAGNOSIS — M25612 Stiffness of left shoulder, not elsewhere classified: Secondary | ICD-10-CM | POA: Diagnosis present

## 2017-12-15 DIAGNOSIS — M25611 Stiffness of right shoulder, not elsewhere classified: Secondary | ICD-10-CM | POA: Insufficient documentation

## 2017-12-15 DIAGNOSIS — M25512 Pain in left shoulder: Secondary | ICD-10-CM | POA: Diagnosis present

## 2017-12-15 DIAGNOSIS — M25511 Pain in right shoulder: Secondary | ICD-10-CM | POA: Diagnosis present

## 2017-12-15 DIAGNOSIS — M542 Cervicalgia: Secondary | ICD-10-CM | POA: Insufficient documentation

## 2017-12-15 NOTE — Therapy (Signed)
[facility] [address] [place], [place], [address] Phone: [phone]   Fax:  [phone]  Physical Therapy Treatment  Patient Details  Name: Emma Ortiz MRN: [id number] Date of Birth: June 23, 1950 Referring Provider: Junius Creamer   Encounter Date: 12/15/2017  PT End of Session - 12/15/17 1000    Visit Number  4    Date for PT Re-Evaluation  01/23/18    PT Start Time  0930    PT Stop Time  1015    PT Time Calculation (min)  45 min    Activity Tolerance  Patient tolerated treatment well    Behavior During Therapy  [facility] for tasks assessed/performed       Past Medical History:  Diagnosis Date  . Hypertension     Past Surgical History:  Procedure Laterality Date  . ROTATOR CUFF REPAIR     left  . TUBAL LIGATION      There were no vitals filed for this visit.  Subjective Assessment - 12/15/17 0930    Subjective  "I feel a little so so"    Currently in Pain?  Yes    Pain Score  7     Pain Location  Shoulder    Pain Orientation  Right                       [facility] Adult PT Treatment/Exercise - 12/15/17 0001      Exercises   Exercises  Shoulder      Shoulder Exercises: Standing   Horizontal ABduction  Strengthening;Both;10 reps;Theraband    Theraband Level (Shoulder Horizontal ABduction)  Level 2 (Red)    External Rotation  Strengthening;10 reps;Theraband    Theraband Level (Shoulder External Rotation)  Level 2 (Red)    Extension  Strengthening;Both;Theraband;20 reps    Theraband Level (Shoulder Extension)  Level 2 (Red)    Row  Strengthening;Both;Theraband;20 reps    Theraband Level (Shoulder Row)  Level 2 (Red)    Other Standing Exercises  5# shruggs and backward rolls 15 each    Other Standing Exercises  wall push ups x10      Shoulder Exercises: ROM/Strengthening   UBE (Upper Arm Bike)  L1 3 min each way    Other ROM/Strengthening Exercises  Rows and Lats 20lb 2x10       Modalities    Modalities  Electrical Stimulation;Moist Heat      Moist Heat Therapy   Number Minutes Moist Heat  15 Minutes    Moist Heat Location  Cervical;Shoulder      Electrical Stimulation   Electrical Stimulation Location  cervical area into the shoulders    Electrical Stimulation Action  IFC    Electrical Stimulation Parameters  sitting    Electrical Stimulation Goals  Pain               PT Short Term Goals - 11/25/17 0823      PT SHORT TERM GOAL #1   Title  independent with initial HEP    Status  Achieved        PT Long Term Goals - 12/10/17 6962      PT LONG TERM GOAL #1   Title  understand proper posture and body mechanics for her job and ADL's    Status  On-going      PT LONG TERM GOAL #2   Title  decrease pain 50%    Status  On-going  PT LONG TERM GOAL #3   Title  increase cervcial ROM 25%    Status  On-going      PT LONG TERM GOAL #4   Title  increase bilateral shoulder ROM to 100 degrees flexion    Status  On-going      PT LONG TERM GOAL #5   Title  report able to do hair most of the time without help    Status  On-going            Plan - 12/15/17 1000    Clinical Impression Statement  Pt bilat shoulder ROM is WFL. shortened treatment session due to pt reports of increase fatigue. All exercises completed well she did have some difficulty with horizontal abduction. Postural cues needed for external rotation. Pt reports no increase in pain with today's activities,     Rehab Potential  Good    PT Frequency  2x / week    PT Duration  8 weeks    PT Treatment/Interventions  ADLs/Self Care Home Management;Electrical Stimulation;Cryotherapy;Moist Heat;Iontophoresis 4mg /ml Dexamethasone;Neuromuscular re-education;Therapeutic exercise;Therapeutic activities;Patient/family education;Manual techniques;Traction    PT Next Visit Plan  slowly work on ROM and functional activities trying to help with pain       Patient will benefit from skilled therapeutic  intervention in order to improve the following deficits and impairments:  Decreased range of motion, Increased muscle spasms, Impaired UE functional use, Pain, Decreased activity tolerance, Improper body mechanics, Postural dysfunction, Decreased strength  Visit Diagnosis: Acute pain of left shoulder  Acute pain of right shoulder  Stiffness of right shoulder, not elsewhere classified     Problem List Patient Active Problem List   Diagnosis Date Noted  . Neck pain 03/19/2014  . Rotator cuff strain 03/19/2014  . Right foot injury 03/19/2014    Grayce Sessions, PTA 12/15/2017, 10:02 AM  [facility] [address] [place], [place], [address] Phone: [phone]   Fax:  [phone]  Name: Cailley Stanko MRN: [id number] Date of Birth: [birth year]

## 2017-12-21 ENCOUNTER — Ambulatory Visit: Payer: Medicare Other | Admitting: Physical Therapy

## 2017-12-23 ENCOUNTER — Encounter: Payer: Self-pay | Admitting: Physical Therapy

## 2017-12-23 ENCOUNTER — Ambulatory Visit: Payer: Medicare Other | Admitting: Physical Therapy

## 2017-12-23 DIAGNOSIS — M25512 Pain in left shoulder: Secondary | ICD-10-CM | POA: Diagnosis not present

## 2017-12-23 DIAGNOSIS — M25611 Stiffness of right shoulder, not elsewhere classified: Secondary | ICD-10-CM

## 2017-12-23 DIAGNOSIS — M25612 Stiffness of left shoulder, not elsewhere classified: Secondary | ICD-10-CM

## 2017-12-23 DIAGNOSIS — M25511 Pain in right shoulder: Secondary | ICD-10-CM

## 2017-12-23 NOTE — Therapy (Signed)
[place], [place], [address] Phone: [phone]   Fax:  [phone]  Physical Therapy Treatment  Patient Details  Name: Emma Ortiz MRN: [id number] Date of Birth: [birth year] Referring Provider: Delle Reining   Encounter Date: 12/23/2017  PT End of Session - 12/23/17 1013    Visit Number  5    Date for PT Re-Evaluation  01/23/18    PT Start Time  0930    PT Stop Time  1028    PT Time Calculation (min)  58 min    Activity Tolerance  Patient tolerated treatment well    Behavior During Therapy  [facility] for tasks assessed/performed       Past Medical History:  Diagnosis Date  . Hypertension     Past Surgical History:  Procedure Laterality Date  . ROTATOR CUFF REPAIR     left  . TUBAL LIGATION      There were no vitals filed for this visit.  Subjective Assessment - 12/23/17 0936    Subjective  "My neck is tight"    Currently in Pain?  Yes    Pain Score  7     Pain Location  --   neck and top of shoulder        [facility] PT Assessment - 12/23/17 0001      AROM   Overall AROM Comments  cervical ROM is decreased 25% with side bending with c/o pain and tightness    AROM Assessment Site  Shoulder    Right/Left Shoulder  Right;Left    Right Shoulder Flexion  111 Degrees    Right Shoulder ABduction  110 Degrees    Right Shoulder Internal Rotation  50 Degrees    Right Shoulder External Rotation  90 Degrees    Left Shoulder Flexion  94 Degrees    Left Shoulder ABduction  102 Degrees    Left Shoulder Internal Rotation  81 Degrees    Left Shoulder External Rotation  90 Degrees                   [facility] Adult PT Treatment/Exercise - 12/23/17 0001      Exercises   Exercises  Shoulder      Shoulder Exercises: Supine   Other Supine Exercises  ce3rvical retraction head on ball      Shoulder Exercises: Standing   Horizontal ABduction  Strengthening;Both;Theraband;20 reps    Theraband Level (Shoulder Horizontal ABduction)  Level 2 (Red)    External Rotation  Strengthening;Theraband;20 reps    Theraband Level (Shoulder External Rotation)  Level 2 (Red)    Extension  Strengthening;Both;Theraband;20 reps    Theraband Level (Shoulder Extension)  Level 2 (Red)    Row  Strengthening;Both;Theraband;20 reps    Theraband Level (Shoulder Row)  Level 2 (Red)    Other Standing Exercises  5# shruggs 15 each; 2lb flex and ext with cane 2x10     Other Standing Exercises  bicept curls 3lb 2x10; tricep press downs 15lb 2x10       Shoulder Exercises: ROM/Strengthening   UBE (Upper Arm Bike)  L1 3 min each way    Other ROM/Strengthening Exercises  Rows and Lats 20lb 2x10       Modalities   Modalities  Electrical Stimulation;Moist Heat      Moist Heat Therapy   Number Minutes Moist Heat  15 Minutes    Moist Heat Location  Cervical;Shoulder      Electrical Stimulation  Electrical Stimulation Location  cervical area into the shoulders    Electrical Stimulation Action  IFDC    Electrical Stimulation Parameters  sitting    Electrical Stimulation Goals  Pain               PT Short Term Goals - 11/25/17 0823      PT SHORT TERM GOAL #1   Title  independent with initial HEP    Status  Achieved        PT Long Term Goals - 12/23/17 0945      PT LONG TERM GOAL #2   Title  decrease pain 50%    Status  Partially Met      PT LONG TERM GOAL #3   Title  increase cervcial ROM 25%    Status  Achieved      PT LONG TERM GOAL #4   Title  increase bilateral shoulder ROM to 100 degrees flexion    Status  Achieved      PT LONG TERM GOAL #5   Title  report able to do hair most of the time without help    Status  Partially Met            Plan - 12/23/17 1013    Clinical Impression Statement  Pt has progressed increasing her shoulder and cervical  ROM meeting those goals. She tolerated all of today's interventions well but does need constant postural cues to  keep shoulders down and back with activity.      Rehab Potential  Good    PT Frequency  2x / week    PT Duration  8 weeks    PT Treatment/Interventions  ADLs/Self Care Home Management;Electrical Stimulation;Cryotherapy;Moist Heat;Iontophoresis '4mg'$ /ml Dexamethasone;Neuromuscular re-education;Therapeutic exercise;Therapeutic activities;Patient/family education;Manual techniques;Traction       Patient will benefit from skilled therapeutic intervention in order to improve the following deficits and impairments:  Decreased range of motion, Increased muscle spasms, Impaired UE functional use, Pain, Decreased activity tolerance, Improper body mechanics, Postural dysfunction, Decreased strength  Visit Diagnosis: Acute pain of left shoulder  Acute pain of right shoulder  Stiffness of right shoulder, not elsewhere classified  Stiffness of left shoulder, not elsewhere classified     Problem List Patient Active Problem List   Diagnosis Date Noted  . Neck pain 03/19/2014  . Rotator cuff strain 03/19/2014  . Right foot injury 03/19/2014    Scot Jun, PTA 12/23/2017, 10:22 AM  [place], [place], [address] Phone: [phone]   Fax:  [phone]  Name: Emma Ortiz MRN: [id number] Date of Birth: 1950/09/29

## 2017-12-27 ENCOUNTER — Encounter: Payer: Self-pay | Admitting: Physical Therapy

## 2017-12-27 ENCOUNTER — Ambulatory Visit: Payer: Medicare Other | Admitting: Physical Therapy

## 2017-12-27 DIAGNOSIS — M25512 Pain in left shoulder: Secondary | ICD-10-CM | POA: Diagnosis not present

## 2017-12-27 DIAGNOSIS — M25611 Stiffness of right shoulder, not elsewhere classified: Secondary | ICD-10-CM

## 2017-12-27 DIAGNOSIS — M25511 Pain in right shoulder: Secondary | ICD-10-CM

## 2017-12-27 NOTE — Therapy (Signed)
[place], [place], [address] Phone: [phone]   Fax:  [phone]  Physical Therapy Treatment  Patient Details  Name: Emma Ortiz MRN: [id number] Date of Birth: Mar 13, 1951 Referring Provider: Delle Reining   Encounter Date: 12/27/2017  PT End of Session - 12/27/17 0838    Visit Number  6    PT Start Time  0759    PT Stop Time  0853    PT Time Calculation (min)  54 min    Activity Tolerance  Patient tolerated treatment well    Behavior During Therapy  [facility] for tasks assessed/performed       Past Medical History:  Diagnosis Date  . Hypertension     Past Surgical History:  Procedure Laterality Date  . ROTATOR CUFF REPAIR     left  . TUBAL LIGATION      There were no vitals filed for this visit.  Subjective Assessment - 12/27/17 0802    Subjective  Pt stated that she does not feel that bad today, just some pain in her R shoulder blade area    Currently in Pain?  Yes    Pain Score  6     Pain Location  Scapula    Pain Orientation  Right                       [facility] Adult PT Treatment/Exercise - 12/27/17 0001      Exercises   Exercises  Shoulder      Shoulder Exercises: Standing   Horizontal ABduction  Strengthening;Both;Theraband;10 reps    Theraband Level (Shoulder Horizontal ABduction)  Level 1 (Yellow)    ABduction  Weights;20 reps;Strengthening    Shoulder ABduction Weight (lbs)  1    Extension  Strengthening;Both;Theraband;20 reps    Theraband Level (Shoulder Extension)  Level 3 (Green)    Row  Strengthening;Both;Theraband;20 reps    Theraband Level (Shoulder Row)  Level 3 (Green)    Other Standing Exercises  Bilat flex with cane 1.5lb 2x10     Other Standing Exercises  bicept curls 3lb 2x10; tricep press downs 15lb 2x10       Shoulder Exercises: ROM/Strengthening   UBE (Upper Arm Bike)  L2.5 3 min each way    Other ROM/Strengthening Exercises  Rows and  Lats 20lb 2x10       Modalities   Modalities  Electrical Stimulation;Moist Heat      Moist Heat Therapy   Number Minutes Moist Heat  15 Minutes    Moist Heat Location  Other (comment)   R posterior flank     Electrical Stimulation   Electrical Stimulation Location  R posterior flank    Electrical Stimulation Action  IFC    Electrical Stimulation Parameters  sitting    Electrical Stimulation Goals  Pain      Manual Therapy   Manual Therapy  Passive ROM    Passive ROM  PROM bilateral shoulders all motions to end range               PT Short Term Goals - 11/25/17 0823      PT SHORT TERM GOAL #1   Title  independent with initial HEP    Status  Achieved        PT Long Term Goals - 12/23/17 0945      PT LONG TERM GOAL #2   Title  decrease pain 50%    Status  Partially Met      PT LONG TERM GOAL #3   Title  increase cervcial ROM 25%    Status  Achieved      PT LONG TERM GOAL #4   Title  increase bilateral shoulder ROM to 100 degrees flexion    Status  Achieved      PT LONG TERM GOAL #5   Title  report able to do hair most of the time without help    Status  Partially Met            Plan - 12/27/17 0839    Clinical Impression Statement  Pt able to complete today's exercises, reports some pain at the top of her shoulders with horizontal abduction. Pt able to achieve greater shoulder flexion motion with cane exercises. Pt has full PROM noted with MT. Pt also able to achieve full Flexion ROM while laying in supine     Rehab Potential  Good    PT Frequency  2x / week    PT Duration  8 weeks    PT Next Visit Plan  slowly work on ROM and functional activities trying to help with pain       Patient will benefit from skilled therapeutic intervention in order to improve the following deficits and impairments:  Decreased range of motion, Increased muscle spasms, Impaired UE functional use, Pain, Decreased activity tolerance, Improper body mechanics, Postural  dysfunction, Decreased strength  Visit Diagnosis: Acute pain of left shoulder  Acute pain of right shoulder  Stiffness of right shoulder, not elsewhere classified     Problem List Patient Active Problem List   Diagnosis Date Noted  . Neck pain 03/19/2014  . Rotator cuff strain 03/19/2014  . Right foot injury 03/19/2014    Scot Jun, PTA 12/27/2017, 8:42 AM  [place], [place], [address] Phone: [phone]   Fax:  [phone]  Name: Gennifer Potenza MRN: [id number] Date of Birth: Mar 16, 1951

## 2017-12-29 ENCOUNTER — Encounter: Payer: Self-pay | Admitting: Physical Therapy

## 2017-12-29 ENCOUNTER — Ambulatory Visit: Payer: Medicare Other | Admitting: Physical Therapy

## 2017-12-29 DIAGNOSIS — M25612 Stiffness of left shoulder, not elsewhere classified: Secondary | ICD-10-CM

## 2017-12-29 DIAGNOSIS — M25512 Pain in left shoulder: Secondary | ICD-10-CM

## 2017-12-29 DIAGNOSIS — M542 Cervicalgia: Secondary | ICD-10-CM

## 2017-12-29 DIAGNOSIS — M25511 Pain in right shoulder: Secondary | ICD-10-CM

## 2017-12-29 DIAGNOSIS — M25611 Stiffness of right shoulder, not elsewhere classified: Secondary | ICD-10-CM

## 2017-12-29 NOTE — Therapy (Signed)
[place], [place], [address] Phone: [phone]   Fax:  [phone]  Physical Therapy Treatment  Patient Details  Name: Emma Ortiz MRN: [id number] Date of Birth: 05-11-1950 Referring Provider: Delle Reining   Encounter Date: 12/29/2017  PT End of Session - 12/29/17 0838    Visit Number  7    Date for PT Re-Evaluation  01/23/18    PT Start Time  0759    PT Stop Time  0853    PT Time Calculation (min)  54 min    Activity Tolerance  Patient tolerated treatment well    Behavior During Therapy  [facility] for tasks assessed/performed       Past Medical History:  Diagnosis Date  . Hypertension     Past Surgical History:  Procedure Laterality Date  . ROTATOR CUFF REPAIR     left  . TUBAL LIGATION      There were no vitals filed for this visit.  Subjective Assessment - 12/29/17 0800    Subjective  Neck feels better, shoulders are not bothering her as much today. A little back pain     Currently in Pain?  Yes    Pain Score  5     Pain Location  Back         [facility] PT Assessment - 12/29/17 0001      AROM   Overall AROM Comments  cervical and shoulder ROM WFL                   [facility] Adult PT Treatment/Exercise - 12/29/17 0001      Exercises   Exercises  Shoulder      Neck Exercises: Standing   Other Standing Exercises  Sit to stand with OHP red ball 2x10       Shoulder Exercises: Standing   Internal Rotation  Theraband;Both;10 reps    Theraband Level (Shoulder Internal Rotation)  Level 2 (Red)    Flexion  Both;20 reps;Weights    Shoulder Flexion Weight (lbs)  2    ABduction  Weights;20 reps;Strengthening    Shoulder ABduction Weight (lbs)  2    Other Standing Exercises  bicept curls 3lb 2x10; tricep press downs 15lb 2x10       Shoulder Exercises: ROM/Strengthening   UBE (Upper Arm Bike)  L3 3 min each way    "W" Arms  10 reps    Other ROM/Strengthening Exercises  Rows  and Lats 20lb 2x10       Moist Heat Therapy   Number Minutes Moist Heat  15 Minutes    Moist Heat Location  Lumbar Spine      Electrical Stimulation   Electrical Stimulation Location  Lumbar spine    Electrical Stimulation Action  IFC    Electrical Stimulation Parameters  sitting, to pt tolerance    Electrical Stimulation Goals  Pain               PT Short Term Goals - 11/25/17 9242      PT SHORT TERM GOAL #1   Title  independent with initial HEP    Status  Achieved        PT Long Term Goals - 12/29/17 0827      PT LONG TERM GOAL #1   Title  understand proper posture and body mechanics for her job and ADL's    Status  Partially Met      PT LONG TERM GOAL #  2   Title  decrease pain 50%    Status  Partially Met      PT LONG TERM GOAL #3   Title  increase cervcial ROM 25%    Status  Achieved      PT LONG TERM GOAL #4   Title  increase bilateral shoulder ROM to 100 degrees flexion    Status  Achieved      PT LONG TERM GOAL #5   Title  report able to do hair most of the time without help    Status  Partially Met            Plan - 12/29/17 7793    Clinical Impression Statement  Pt has progressed increasing both her cervical and shoulder ROM. She reports less pain overall this morning. Tolerated all interventions well. She did require cues to keep elbows by her side with internal rotation. She reports that's he can do her hair sometimes without help depending on how her arm feel that day.    Rehab Potential  Good    PT Frequency  2x / week    PT Duration  8 weeks    PT Treatment/Interventions  ADLs/Self Care Home Management;Electrical Stimulation;Cryotherapy;Moist Heat;Iontophoresis '4mg'$ /ml Dexamethasone;Neuromuscular re-education;Therapeutic exercise;Therapeutic activities;Patient/family education;Manual techniques;Traction    PT Next Visit Plan  functional activities try to help with pain       Patient will benefit from skilled therapeutic intervention  in order to improve the following deficits and impairments:  Decreased range of motion, Increased muscle spasms, Impaired UE functional use, Pain, Decreased activity tolerance, Improper body mechanics, Postural dysfunction, Decreased strength  Visit Diagnosis: Acute pain of left shoulder  Acute pain of right shoulder  Stiffness of right shoulder, not elsewhere classified  Stiffness of left shoulder, not elsewhere classified  Cervicalgia     Problem List Patient Active Problem List   Diagnosis Date Noted  . Neck pain 03/19/2014  . Rotator cuff strain 03/19/2014  . Right foot injury 03/19/2014    Scot Jun, PTA 12/29/2017, 8:40 AM  [place], [place], [address] Phone: [phone]   Fax:  [phone]  Name: Taylah Dubiel MRN: [id number] Date of Birth: 1950/12/06

## 2018-01-03 ENCOUNTER — Ambulatory Visit: Payer: Medicare Other | Admitting: Physical Therapy

## 2018-01-03 ENCOUNTER — Encounter: Payer: Self-pay | Admitting: Physical Therapy

## 2018-01-03 DIAGNOSIS — M542 Cervicalgia: Secondary | ICD-10-CM

## 2018-01-03 DIAGNOSIS — M25512 Pain in left shoulder: Secondary | ICD-10-CM

## 2018-01-03 DIAGNOSIS — M25611 Stiffness of right shoulder, not elsewhere classified: Secondary | ICD-10-CM

## 2018-01-03 DIAGNOSIS — M25511 Pain in right shoulder: Secondary | ICD-10-CM

## 2018-01-03 NOTE — Therapy (Signed)
[place], [place], [address] Phone: [phone]   Fax:  [phone]  Physical Therapy Treatment  Patient Details  Name: Emma Ortiz MRN: [id number] Date of Birth: 01/03/1951 Referring Provider: Delle Reining   Encounter Date: 01/03/2018  PT End of Session - 01/03/18 0927    Visit Number  8    Date for PT Re-Evaluation  01/23/18    PT Start Time  0845    PT Stop Time  0941    PT Time Calculation (min)  56 min    Activity Tolerance  Patient tolerated treatment well    Behavior During Therapy  [facility] for tasks assessed/performed       Past Medical History:  Diagnosis Date  . Hypertension     Past Surgical History:  Procedure Laterality Date  . ROTATOR CUFF REPAIR     left  . TUBAL LIGATION      There were no vitals filed for this visit.  Subjective Assessment - 01/03/18 0844    Subjective  "Just my neck stiffed up today"    Currently in Pain?  Yes    Pain Location  Neck    Pain Descriptors / Indicators  --   stiffness                      [facility] Adult PT Treatment/Exercise - 01/03/18 0001      Neck Exercises: Standing   Other Standing Exercises  Sit to stand with OHP red ball 2x10       Shoulder Exercises: Supine   Other Supine Exercises  ce3rvical retraction head on ball      Shoulder Exercises: Standing   Internal Rotation  Theraband;Both;10 reps    Theraband Level (Shoulder Internal Rotation)  Level 2 (Red)    Flexion  Both;20 reps;Weights    Shoulder Flexion Weight (lbs)  2    ABduction  Weights;Strengthening;10 reps    Shoulder ABduction Weight (lbs)  1    Extension  Strengthening;Both;Theraband;20 reps    Theraband Level (Shoulder Extension)  Level 3 (Green)    Row  Strengthening;Both;Theraband;20 reps    Theraband Level (Shoulder Row)  Level 3 (Green)    Other Standing Exercises  bicept curls 4lb 2x10; tricep press downs 15lb 2x10       Shoulder  Exercises: ROM/Strengthening   UBE (Upper Arm Bike)  L3 3 min each way    Other ROM/Strengthening Exercises  Rows and Lats 20lb 2x15      Moist Heat Therapy   Number Minutes Moist Heat  15 Minutes    Moist Heat Location  Shoulder;Cervical      Electrical Stimulation   Electrical Stimulation Location  cervical area into the shoulders    Electrical Stimulation Action  IFC    Electrical Stimulation Parameters  sitting               PT Short Term Goals - 11/25/17 0823      PT SHORT TERM GOAL #1   Title  independent with initial HEP    Status  Achieved        PT Long Term Goals - 12/29/17 0827      PT LONG TERM GOAL #1   Title  understand proper posture and body mechanics for her job and ADL's    Status  Partially Met      PT LONG TERM GOAL #2   Title  decrease pain 50%  Status  Partially Met      PT LONG TERM GOAL #3   Title  increase cervcial ROM 25%    Status  Achieved      PT LONG TERM GOAL #4   Title  increase bilateral shoulder ROM to 100 degrees flexion    Status  Achieved      PT LONG TERM GOAL #5   Title  report able to do hair most of the time without help    Status  Partially Met            Plan - 01/03/18 0928    Clinical Impression Statement  Pt reports that she is feeling better overall. Despite having full active shoulder ROM last visit but was on lt able to achieve ~ 90 degrees of shoulder abduction today, due to shoulder pain. I ask if anything changed she stated that she has never done that's move ment before. I  informed her that she has done that movement last visit she simply stated she didn't remember. All of the other exercises completed well.     Rehab Potential  Good    PT Frequency  2x / week    PT Duration  8 weeks    PT Next Visit Plan  functional activities try to help with pain       Patient will benefit from skilled therapeutic intervention in order to improve the following deficits and impairments:  Decreased range of  motion, Increased muscle spasms, Impaired UE functional use, Pain, Decreased activity tolerance, Improper body mechanics, Postural dysfunction, Decreased strength  Visit Diagnosis: Acute pain of left shoulder  Acute pain of right shoulder  Stiffness of right shoulder, not elsewhere classified  Cervicalgia     Problem List Patient Active Problem List   Diagnosis Date Noted  . Neck pain 03/19/2014  . Rotator cuff strain 03/19/2014  . Right foot injury 03/19/2014    Scot Jun, PTA 01/03/2018, 9:35 AM  [place], [place], [address] Phone: [phone]   Fax:  [phone]  Name: Emma Ortiz MRN: [id number] Date of Birth: 1950-06-19

## 2018-01-07 ENCOUNTER — Encounter: Payer: Self-pay | Admitting: Physical Therapy

## 2018-01-10 ENCOUNTER — Encounter: Payer: Self-pay | Admitting: Physical Therapy

## 2018-01-10 ENCOUNTER — Ambulatory Visit: Payer: Medicare Other | Admitting: Physical Therapy

## 2018-01-10 DIAGNOSIS — M25611 Stiffness of right shoulder, not elsewhere classified: Secondary | ICD-10-CM

## 2018-01-10 DIAGNOSIS — M25512 Pain in left shoulder: Secondary | ICD-10-CM

## 2018-01-10 DIAGNOSIS — M25511 Pain in right shoulder: Secondary | ICD-10-CM

## 2018-01-10 NOTE — Therapy (Signed)
[place], [place], [address] Phone: [phone]   Fax:  [phone]  Physical Therapy Treatment  Patient Details  Name: Emma Ortiz MRN: [id number] Date of Birth: 01-14-1951 Referring Provider (PT): TL Luciana Axe   Encounter Date: 01/10/2018  PT End of Session - 01/10/18 0924    Visit Number  9    Date for PT Re-Evaluation  01/23/18    PT Start Time  0845    PT Stop Time  0939    PT Time Calculation (min)  54 min    Activity Tolerance  Patient tolerated treatment well    Behavior During Therapy  [facility] for tasks assessed/performed       Past Medical History:  Diagnosis Date  . Hypertension     Past Surgical History:  Procedure Laterality Date  . ROTATOR CUFF REPAIR     left  . TUBAL LIGATION      There were no vitals filed for this visit.  Subjective Assessment - 01/10/18 0848    Subjective  "Im fine, just some soreness, but I will be all right"    Currently in Pain?  No/denies    Pain Score  0-No pain                       [facility] Adult PT Treatment/Exercise - 01/10/18 0001      Shoulder Exercises: Standing   Flexion  Both;20 reps;Weights    Shoulder Flexion Weight (lbs)  2    Extension  Strengthening;Both;15 reps;Theraband   x2   Theraband Level (Shoulder Extension)  Level 4 (Blue)    Row  Theraband;15 reps;Strengthening;Both   x2   Theraband Level (Shoulder Row)  Level 4 (Blue)    Other Standing Exercises  bicept curls 10lb 2x10; tricep press downs 15lb 2x10       Shoulder Exercises: ROM/Strengthening   UBE (Upper Arm Bike)  L3 3 min each way, recumbant bike L0 x4 min     Other ROM/Strengthening Exercises  Rows and Lats 25lb 2x10      Moist Heat Therapy   Number Minutes Moist Heat  15 Minutes    Moist Heat Location  Shoulder;Cervical      Electrical Stimulation   Electrical Stimulation Location  cervical area into the shoulders    Electrical Stimulation  Action  IFC    Electrical Stimulation Parameters  sitting    Electrical Stimulation Goals  Pain               PT Short Term Goals - 11/25/17 5038      PT SHORT TERM GOAL #1   Title  independent with initial HEP    Status  Achieved        PT Long Term Goals - 12/29/17 0827      PT LONG TERM GOAL #1   Title  understand proper posture and body mechanics for her job and ADL's    Status  Partially Met      PT LONG TERM GOAL #2   Title  decrease pain 50%    Status  Partially Met      PT LONG TERM GOAL #3   Title  increase cervcial ROM 25%    Status  Achieved      PT LONG TERM GOAL #4   Title  increase bilateral shoulder ROM to 100 degrees flexion    Status  Achieved  PT LONG TERM GOAL #5   Title  report able to do hair most of the time without help    Status  Partially Met            Plan - 01/10/18 0925    Clinical Impression Statement  Pt only reports some soreness in her upper back. All exercises completed well, does need constant cues to keep shoulder down with triceps extensions. Reports some difficulty with standing shoulder abduction.    Rehab Potential  Good    PT Frequency  2x / week    PT Duration  8 weeks    PT Treatment/Interventions  ADLs/Self Care Home Management;Electrical Stimulation;Cryotherapy;Moist Heat;Iontophoresis 60m/ml Dexamethasone;Neuromuscular re-education;Therapeutic exercise;Therapeutic activities;Patient/family education;Manual techniques;Traction    PT Next Visit Plan  functional activities try to help with pain       Patient will benefit from skilled therapeutic intervention in order to improve the following deficits and impairments:  Decreased range of motion, Increased muscle spasms, Impaired UE functional use, Pain, Decreased activity tolerance, Improper body mechanics, Postural dysfunction, Decreased strength  Visit Diagnosis: Acute pain of left shoulder  Acute pain of right shoulder  Stiffness of right shoulder,  not elsewhere classified     Problem List Patient Active Problem List   Diagnosis Date Noted  . Neck pain 03/19/2014  . Rotator cuff strain 03/19/2014  . Right foot injury 03/19/2014    RScot Jun PTA 01/10/2018, 9:27 AM  [place] [address]Phone: [phone]  Fax:  [phone] Name: NAniko FinniganMRN: [id number] of Birth: [birth year]

## 2018-01-14 ENCOUNTER — Ambulatory Visit: Payer: Medicare Other | Attending: Family Medicine | Admitting: Physical Therapy

## 2018-01-14 ENCOUNTER — Encounter: Payer: Self-pay | Admitting: Physical Therapy

## 2018-01-14 DIAGNOSIS — M25511 Pain in right shoulder: Secondary | ICD-10-CM | POA: Insufficient documentation

## 2018-01-14 DIAGNOSIS — M25512 Pain in left shoulder: Secondary | ICD-10-CM | POA: Diagnosis not present

## 2018-01-14 DIAGNOSIS — M542 Cervicalgia: Secondary | ICD-10-CM | POA: Diagnosis present

## 2018-01-14 DIAGNOSIS — M25611 Stiffness of right shoulder, not elsewhere classified: Secondary | ICD-10-CM | POA: Diagnosis present

## 2018-01-14 NOTE — Therapy (Signed)
[facility] [place] [address] [place], [place], [address] Phone: [phone]   Fax:  [phone]  Physical Therapy Treatment  Patient Details  Name: Emma Ortiz MRN: [id number] Date of Birth: 1950-10-10 Referring Provider (PT): TL Leavy Cella   Encounter Date: 01/14/2018  PT End of Session - 01/14/18 0914    Visit Number  10    Date for PT Re-Evaluation  01/23/18    PT Start Time  0845    PT Stop Time  0930    PT Time Calculation (min)  45 min    Activity Tolerance  Patient tolerated treatment well    Behavior During Therapy  [facility] for tasks assessed/performed       Past Medical History:  Diagnosis Date  . Hypertension     Past Surgical History:  Procedure Laterality Date  . ROTATOR CUFF REPAIR     left  . TUBAL LIGATION      There were no vitals filed for this visit.  Subjective Assessment - 01/14/18 0844    Subjective  "Pretty good"    Currently in Pain?  No/denies                       [facility] Adult PT Treatment/Exercise - 01/14/18 0001      Shoulder Exercises: Standing   Flexion  Both;20 reps;Weights    Shoulder Flexion Weight (lbs)  2    ABduction  Weights;Strengthening;10 reps    Shoulder ABduction Weight (lbs)  1    Extension  Strengthening;Both;15 reps;Theraband   x2   Theraband Level (Shoulder Extension)  Level 3 (Green)    Row  Theraband;15 reps;Both    Theraband Level (Shoulder Row)  Level 3 (Green)    Other Standing Exercises  bicept curls 10lb 2x10; tricep press downs 15lb 2x10       Shoulder Exercises: ROM/Strengthening   UBE (Upper Arm Bike)  L3 3 min each way, recumbant bike L0 x4 min     Other ROM/Strengthening Exercises  Rows and Lats 25lb 2x15      Moist Heat Therapy   Number Minutes Moist Heat  15 Minutes    Moist Heat Location  Shoulder;Cervical      Electrical Stimulation   Electrical Stimulation Location  cervical area into the shoulders    Electrical Stimulation  Action  IFC    Electrical Stimulation Parameters  sitting    Electrical Stimulation Goals  Pain               PT Short Term Goals - 11/25/17 0823      PT SHORT TERM GOAL #1   Title  independent with initial HEP    Status  Achieved        PT Long Term Goals - 01/14/18 0850      PT LONG TERM GOAL #2   Title  decrease pain 50%    Status  Achieved            Plan - 01/14/18 0914    Clinical Impression Statement  Pt is doing good and progressing towards goals. All exercises completed without issue, she reports a little pain in the top of her L trap.     Rehab Potential  Good    PT Frequency  2x / week    PT Treatment/Interventions  ADLs/Self Care Home Management;Electrical Stimulation;Cryotherapy;Moist Heat;Iontophoresis 4mg /ml Dexamethasone;Neuromuscular re-education;Therapeutic exercise;Therapeutic activities;Patient/family education;Manual techniques;Traction    PT Next Visit Plan  functional activities  try to help with pain       Patient will benefit from skilled therapeutic intervention in order to improve the following deficits and impairments:  Decreased range of motion, Increased muscle spasms, Impaired UE functional use, Pain, Decreased activity tolerance, Improper body mechanics, Postural dysfunction, Decreased strength  Visit Diagnosis: Acute pain of left shoulder  Acute pain of right shoulder  Stiffness of right shoulder, not elsewhere classified  Cervicalgia     Problem List Patient Active Problem List   Diagnosis Date Noted  . Neck pain 03/19/2014  . Rotator cuff strain 03/19/2014  . Right foot injury 03/19/2014    Grayce Sessions, PTA 01/14/2018, 9:18 AM  [facility] [place] [address] [place], [place], [address] Phone: [phone]   Fax:  [phone]  Name: Emma Ortiz MRN: [id number] Date of Birth: [birth year]

## 2018-01-18 ENCOUNTER — Encounter: Payer: Self-pay | Admitting: Physical Therapy

## 2018-01-18 ENCOUNTER — Ambulatory Visit: Payer: Medicare Other | Admitting: Physical Therapy

## 2018-01-18 DIAGNOSIS — M25512 Pain in left shoulder: Secondary | ICD-10-CM | POA: Diagnosis not present

## 2018-01-18 DIAGNOSIS — M25611 Stiffness of right shoulder, not elsewhere classified: Secondary | ICD-10-CM

## 2018-01-18 DIAGNOSIS — M25511 Pain in right shoulder: Secondary | ICD-10-CM

## 2018-01-18 NOTE — Therapy (Signed)
[facility] [place], [place], [address] Phone: [phone]   Fax:  [phone]  Physical Therapy Treatment  Patient Details  Name: Emma Ortiz MRN: [id number] Date of Birth: Mar 14, 1951 Referring Provider (PT): TL Luciana Axe   Encounter Date: 01/18/2018  PT End of Session - 01/18/18 0919    Visit Number  11    Date for PT Re-Evaluation  01/23/18    PT Start Time  0845    PT Stop Time  0935    PT Time Calculation (min)  50 min    Activity Tolerance  Patient tolerated treatment well    Behavior During Therapy  [facility] for tasks assessed/performed       Past Medical History:  Diagnosis Date  . Hypertension     Past Surgical History:  Procedure Laterality Date  . ROTATOR CUFF REPAIR     left  . TUBAL LIGATION      There were no vitals filed for this visit.  Subjective Assessment - 01/18/18 0847    Subjective  "Im doing pretty good except for this shoulder" Pt reports that her L shoulder feels weak.     Currently in Pain?  Yes    Pain Score  7     Pain Location  Shoulder    Pain Orientation  Left    Pain Descriptors / Indicators  Aching                       [facility] Adult PT Treatment/Exercise - 01/18/18 0001      Exercises   Exercises  Shoulder      Shoulder Exercises: Seated   Other Seated Exercises  bent over rows, ext, and flys 3lb 2x10      Shoulder Exercises: Standing   Internal Rotation  Theraband;Both;15 reps    Theraband Level (Shoulder Internal Rotation)  Level 1 (Yellow)    Other Standing Exercises  OHP wt ball 2x10       Shoulder Exercises: ROM/Strengthening   UBE (Upper Arm Bike)  L3 3 min each way    Other ROM/Strengthening Exercises  Chest press 10 lb 2x10     Other ROM/Strengthening Exercises  Rows and Lats 25lb 2x15      Moist Heat Therapy   Number Minutes Moist Heat  15 Minutes    Moist Heat Location  Shoulder      Electrical Stimulation   Electrical  Stimulation Location  L Shoulder     Electrical Stimulation Action  IFC    Electrical Stimulation Parameters  sitting    Electrical Stimulation Goals  Pain               PT Short Term Goals - 11/25/17 2423      PT SHORT TERM GOAL #1   Title  independent with initial HEP    Status  Achieved        PT Long Term Goals - 01/18/18 0904      PT LONG TERM GOAL #2   Title  decrease pain 50%      PT LONG TERM GOAL #3   Title  increase cervcial ROM 25%    Status  Achieved      PT LONG TERM GOAL #4   Title  increase bilateral shoulder ROM to 100 degrees flexion    Status  Achieved      PT LONG TERM GOAL #5   Title  report able to do  hair most of the time without help    Status  Partially Met            Plan - 01/18/18 0920    Clinical Impression Statement  Pt enters clinic reporting that he is doing well overall, but is having some aching in her L shoulder. She did well with both ER & IR activities, some weakness noted with OHP.    Rehab Potential  Good    PT Frequency  2x / week    PT Duration  8 weeks    PT Treatment/Interventions  ADLs/Self Care Home Management;Electrical Stimulation;Cryotherapy;Moist Heat;Iontophoresis 44m/ml Dexamethasone;Neuromuscular re-education;Therapeutic exercise;Therapeutic activities;Patient/family education;Manual techniques;Traction    PT Next Visit Plan  functional activities try to help with pain, D/C within next 2 visits.       Patient will benefit from skilled therapeutic intervention in order to improve the following deficits and impairments:  Decreased range of motion, Increased muscle spasms, Impaired UE functional use, Pain, Decreased activity tolerance, Improper body mechanics, Postural dysfunction, Decreased strength  Visit Diagnosis: Acute pain of right shoulder  Acute pain of left shoulder  Stiffness of right shoulder, not elsewhere classified     Problem List Patient Active Problem List   Diagnosis Date Noted   . Neck pain 03/19/2014  . Rotator cuff strain 03/19/2014  . Right foot injury 03/19/2014    RScot Jun PTA 01/18/2018, 9:22 AM  [facility] [address]: [phone]  Fax:  [phone] Name: Emma PondexterMRN: [id number] of Birth: [birth year]

## 2018-01-21 ENCOUNTER — Encounter: Payer: Self-pay | Admitting: Physical Therapy

## 2018-01-21 ENCOUNTER — Ambulatory Visit: Payer: Medicare Other | Admitting: Physical Therapy

## 2018-01-21 DIAGNOSIS — M25511 Pain in right shoulder: Secondary | ICD-10-CM

## 2018-01-21 DIAGNOSIS — M25512 Pain in left shoulder: Secondary | ICD-10-CM

## 2018-01-21 DIAGNOSIS — M25611 Stiffness of right shoulder, not elsewhere classified: Secondary | ICD-10-CM

## 2018-01-21 NOTE — Therapy (Signed)
[facility] [place], [place], [address] Phone: [phone]   Fax:  [phone]  Physical Therapy Treatment  Patient Details  Name: Emma Ortiz MRN: [id number] Date of Birth: 1951/02/04 Referring Provider (PT): TL Luciana Axe   Encounter Date: 01/21/2018  PT End of Session - 01/21/18 0820    Visit Number  12    Date for PT Re-Evaluation  01/23/18    PT Start Time  0800    PT Stop Time  0835    PT Time Calculation (min)  35 min    Activity Tolerance  Patient tolerated treatment well    Behavior During Therapy  [facility] for tasks assessed/performed       Past Medical History:  Diagnosis Date  . Hypertension     Past Surgical History:  Procedure Laterality Date  . ROTATOR CUFF REPAIR     left  . TUBAL LIGATION      There were no vitals filed for this visit.  Subjective Assessment - 01/21/18 0802    Subjective  "Doing fine"    Currently in Pain?  No/denies    Pain Score  0-No pain                       [facility] Adult PT Treatment/Exercise - 01/21/18 0001      Shoulder Exercises: ROM/Strengthening   UBE (Upper Arm Bike)  L3.5 3 min each way    Other ROM/Strengthening Exercises  Chest press 15 vlb 2x10     Other ROM/Strengthening Exercises  Rows and Lats 25lb 2x15      Moist Heat Therapy   Number Minutes Moist Heat  15 Minutes    Moist Heat Location  Shoulder      Electrical Stimulation   Electrical Stimulation Location  L Shoulder     Electrical Stimulation Action  IFC    Electrical Stimulation Parameters  sitting    Electrical Stimulation Goals  Pain               PT Short Term Goals - 11/25/17 2229      PT SHORT TERM GOAL #1   Title  independent with initial HEP    Status  Achieved        PT Long Term Goals - 01/21/18 7989      PT LONG TERM GOAL #1   Title  understand proper posture and body mechanics for her job and ADL's    Status  Achieved      PT LONG  TERM GOAL #2   Title  decrease pain 50%    Status  Achieved      PT LONG TERM GOAL #3   Status  Achieved      PT LONG TERM GOAL #5   Title  report able to do hair most of the time without help    Status  Achieved            Plan - 01/21/18 2119    Clinical Impression Statement  goals met, pt is pleased with her current functional status.    PT Frequency  2x / week    PT Duration  8 weeks    PT Treatment/Interventions  ADLs/Self Care Home Management;Electrical Stimulation;Cryotherapy;Moist Heat;Iontophoresis '4mg'$ /ml Dexamethasone;Neuromuscular re-education;Therapeutic exercise;Therapeutic activities;Patient/family education;Manual techniques;Traction    PT Next Visit Plan  D/C PT       Patient will benefit from skilled therapeutic intervention in order to improve the following  deficits and impairments:  Decreased range of motion, Increased muscle spasms, Impaired UE functional use, Pain, Decreased activity tolerance, Improper body mechanics, Postural dysfunction, Decreased strength  Visit Diagnosis: Acute pain of right shoulder  Stiffness of right shoulder, not elsewhere classified  Acute pain of left shoulder     Problem List Patient Active Problem List   Diagnosis Date Noted  . Neck pain 03/19/2014  . Rotator cuff strain 03/19/2014  . Right foot injury 03/19/2014    Scot Jun, PTA 01/21/2018, 8:23 AM  [place], [place], [address] Phone: [phone]   Fax:  [phone]  Name: Emma Ortiz MRN: [id number] Date of Birth: 1951-01-14

## 2020-06-24 ENCOUNTER — Emergency Department (HOSPITAL_BASED_OUTPATIENT_CLINIC_OR_DEPARTMENT_OTHER): Payer: Medicare Other

## 2020-06-24 ENCOUNTER — Emergency Department (HOSPITAL_BASED_OUTPATIENT_CLINIC_OR_DEPARTMENT_OTHER)
Admission: EM | Admit: 2020-06-24 | Discharge: 2020-06-24 | Disposition: A | Discharge status: Home / Self Care | Payer: Medicare Other | Attending: Emergency Medicine | Admitting: Emergency Medicine

## 2020-06-24 ENCOUNTER — Other Ambulatory Visit: Payer: Self-pay

## 2020-06-24 ENCOUNTER — Encounter (HOSPITAL_BASED_OUTPATIENT_CLINIC_OR_DEPARTMENT_OTHER): Payer: Self-pay | Admitting: Emergency Medicine

## 2020-06-24 DIAGNOSIS — M546 Pain in thoracic spine: Secondary | ICD-10-CM | POA: Insufficient documentation

## 2020-06-24 DIAGNOSIS — W19XXXA Unspecified fall, initial encounter: Secondary | ICD-10-CM | POA: Diagnosis not present

## 2020-06-24 DIAGNOSIS — M542 Cervicalgia: Secondary | ICD-10-CM | POA: Diagnosis not present

## 2020-06-24 DIAGNOSIS — Z79899 Other long term (current) drug therapy: Secondary | ICD-10-CM | POA: Diagnosis not present

## 2020-06-24 DIAGNOSIS — M25512 Pain in left shoulder: Secondary | ICD-10-CM | POA: Insufficient documentation

## 2020-06-24 DIAGNOSIS — I10 Essential (primary) hypertension: Secondary | ICD-10-CM | POA: Insufficient documentation

## 2020-06-24 DIAGNOSIS — M549 Dorsalgia, unspecified: Secondary | ICD-10-CM

## 2020-06-24 MED ORDER — ACETAMINOPHEN 325 MG PO TABS
650.0000 mg | ORAL_TABLET | Freq: Once | ORAL | Status: AC
  Administered 2020-06-24: 650 mg via ORAL
  Filled 2020-06-24: qty 2

## 2020-06-24 MED ORDER — DICLOFENAC SODIUM 1 % EX GEL: 2.0000 g | Freq: Four times a day (QID) | CUTANEOUS | 0 refills | Status: AC

## 2020-06-24 MED ORDER — HYDROCODONE-ACETAMINOPHEN 5-325 MG PO TABS
1.0000 | ORAL_TABLET | Freq: Once | ORAL | Status: AC
  Administered 2020-06-24: 1 via ORAL
  Filled 2020-06-24: qty 1

## 2020-06-24 MED ORDER — METHOCARBAMOL 500 MG PO TABS: 500.0000 mg | ORAL_TABLET | Freq: Two times a day (BID) | ORAL | 0 refills | Status: DC

## 2020-06-24 NOTE — ED Provider Notes (Signed)
[facility] EMERGENCY DEPARTMENT Provider Note   CSN: [id number] Arrival date & time: 06/24/20  1717     History Chief Complaint  Patient presents with  . Fall    Emma Ortiz is a 70 y.o. female  HPI  Patient is a 70 year old female with past medical history significant for hypertension, chronic neck pain, rotator cuff strain  She is presented today with some neck pain and upper back pain as well as some left shoulder pain she states that she has pain in these areas at baseline however she did fall yesterday when she was checking the mail she states that she fell forward into a large garbage bin that was made of plastic.  She states she is married the top of her head on the bed but did not lose consciousness or have any nausea vomiting or headaches.  She states she got up and walked back to her house but states that she has continued to have some achy neck pain since.  No other associate symptoms.  She denies any significant severe pain.  She denies any lightheadedness or dizziness.  She states he has not had any chest pain or shortness of breath.  She states that the fall was mechanical he denies any symptoms prior to the fall.  She has not had any confusion she states that she drove to the ER herself because she wanted to be checked out.    Past Medical History:  Diagnosis Date  . Hypertension     Patient Active Problem List   Diagnosis Date Noted  . Neck pain 03/19/2014  . Rotator cuff strain 03/19/2014  . Right foot injury 03/19/2014    Past Surgical History:  Procedure Laterality Date  . ROTATOR CUFF REPAIR     left  . TUBAL LIGATION       OB History   No obstetric history on file.     No family history on file.  Social History   Tobacco Use  . Smoking status: Never Smoker  . Smokeless tobacco: Never Used  Substance Use Topics  . Alcohol use: No    Alcohol/week: 0.0 standard drinks  . Drug use: No    Home Medications Prior to Admission  medications   Medication Sig Start Date End Date Taking? Authorizing Provider  diclofenac Sodium (VOLTAREN) 1 % GEL Apply 2 g topically 4 (four) times daily. 06/24/20  Yes Fondaw, Wylder S, PA  methocarbamol (ROBAXIN) 500 MG tablet Take 1 tablet (500 mg total) by mouth 2 (two) times daily. 06/24/20  Yes Fondaw, Wylder S, PA  amLODipine (NORVASC) 10 MG tablet Take 25 mg by mouth daily.    [provider]  hydrochlorothiazide (HYDRODIURIL) 25 MG tablet Take 25 mg by mouth daily.    [provider]  naproxen (NAPROSYN) 500 MG tablet Take 1 tablet (500 mg total) by mouth 2 (two) times daily. 09/10/14   Hess, Nada Boozer, PA-C    Allergies    Hydrochlorothiazide, Lisinopril, and Codeine  Review of Systems   Review of Systems  Constitutional: Negative for fever.  HENT: Negative for congestion.   Respiratory: Negative for shortness of breath.   Cardiovascular: Negative for chest pain.  Gastrointestinal: Negative for abdominal distention.  Musculoskeletal:       Left shoulder pain, neck pain, back pain  Neurological: Negative for dizziness and headaches.    Physical Exam Updated Vital Signs BP (!) 154/97   Pulse 73   Temp 98.4 F (36.9 C) (Oral)  Resp 16   Ht 5\' 3"  (1.6 m)   Wt 108 kg   SpO2 100%   BMI 42.16 kg/m   Physical Exam Vitals and nursing note reviewed.  Constitutional:      General: She is not in acute distress. HENT:     Head: Normocephalic and atraumatic.     Nose: Nose normal.  Eyes:     General: No scleral icterus. Cardiovascular:     Rate and Rhythm: Normal rate and regular rhythm.     Pulses: Normal pulses.     Heart sounds: Normal heart sounds.  Pulmonary:     Effort: Pulmonary effort is normal. No respiratory distress.     Breath sounds: No wheezing.  Abdominal:     Palpations: Abdomen is soft.     Tenderness: There is no abdominal tenderness.  Musculoskeletal:     Cervical back: Normal range of motion.     Right lower leg: No edema.      Left lower leg: No edema.     Comments: Mild left-sided shoulder tenderness to palpation.  No focal tenderness no step-off or deformity. Patient does have some diffuse muscular tenderness in the paracervical and parathoracic muscular region.  No other bony tenderness over joints or long bones of the upper and lower extremities.     Full range of motion of upper and lower extremity joints shown after palpation was conducted; with 5/5 symmetrical strength in upper and lower extremities. No chest wall tenderness, no facial or cranial tenderness.   Patient has intact sensation grossly in lower and upper extremities. Intact patellar and ankle reflexes. Patient able to ambulate without difficulty.  Radial and DP pulses palpated BL.   Skin:    General: Skin is warm and dry.     Capillary Refill: Capillary refill takes less than 2 seconds.  Neurological:     Mental Status: She is alert. Mental status is at baseline.     Comments: Ambulatory, good coordination, A&O x3  Psychiatric:        Mood and Affect: Mood normal.        Behavior: Behavior normal.     ED Results / Procedures / Treatments   Labs (all labs ordered are listed, but only abnormal results are displayed) Labs Reviewed - No data to display  EKG None  Radiology DG Cervical Spine Complete  Result Date: 06/24/2020 CLINICAL DATA:  Status post fall. EXAM: CERVICAL SPINE - COMPLETE 4+ VIEW COMPARISON:  None. FINDINGS: There is no evidence of acute cervical spine fracture or prevertebral soft tissue swelling. Alignment is normal. Moderate severity endplate sclerosis is seen at the levels of C3-C4, C4-C5, C5-C6 and C6-C7. Marked severity intervertebral disc space narrowing is also seen at these levels. IMPRESSION: 1. No acute findings in the cervical spine. 2. Marked severity degenerative disc disease from C3-C4 through C6-C7. Electronically Signed   By: 06/26/2020 M.D.   On: 06/24/2020 20:01   DG Thoracic Spine 2  View  Result Date: 06/24/2020 CLINICAL DATA:  Status post fall. EXAM: THORACIC SPINE 2 VIEWS COMPARISON:  None. FINDINGS: There is no evidence of an acute thoracic spine fracture. Mild levoscoliosis of the lower thoracic spine is noted. Mild multilevel lateral osteophyte formation is seen with mild to moderate severity multilevel intervertebral disc space narrowing. IMPRESSION: Mild levoscoliosis of the lower thoracic spine with mild to moderate multilevel degenerative changes. Electronically Signed   By: 06/26/2020 M.D.   On: 06/24/2020 19:47   DG Shoulder Left  Result Date: 06/24/2020 CLINICAL DATA:  Status post fall. EXAM: LEFT SHOULDER - 2+ VIEW COMPARISON:  None. FINDINGS: There is no evidence of an acute fracture or dislocation. A chronic deformity is seen along the greater tubercle of the left humeral head and within the distal aspect of the left clavicle. Soft tissues are unremarkable. IMPRESSION: Chronic changes without evidence of an acute fracture or dislocation. Electronically Signed   By: Aram Candela M.D.   On: 06/24/2020 19:45    Procedures Procedures   Medications Ordered in ED Medications  HYDROcodone-acetaminophen (NORCO/VICODIN) 5-325 MG per tablet 1 tablet (1 tablet Oral Given 06/24/20 1941)  acetaminophen (TYLENOL) tablet 650 mg (650 mg Oral Given 06/24/20 1941)    ED Course  I have reviewed the triage vital signs and the nursing notes.  Pertinent labs & imaging results that were available during my care of the patient were reviewed by me and considered in my medical decision making (see chart for details).  Clinical Course as of 06/25/20 1421  Mon Jun 24, 2020  2002 X-ray imaging of thoracic, cervical spine and shoulder/left shoulder Reviewed myself.  I agree with radiologist read that these are chronic degenerative disease is shown with no acute abnormalities or fractures. [WF]  2009 IMPRESSION: 1. No acute findings in the cervical spine. 2. Marked  severity degenerative disc disease from C3-C4 through C6-C7. [WF]    Clinical Course User Index [WF] Gailen Shelter, PA   MDM Rules/Calculators/A&P                          Patient is 70 year old female had very low mechanism fall yesterday and had some likely muscular pain today.  Given her age will obtain x-ray imaging of the C and T-spine as well as left shoulder I have low suspicion for fracture however.  X-rays reviewed above.  I agree of radiology read no fractures.  Patient given reassurance she is relieved to hear that she has no broken bones.  She will follow-up with her primary care doctor and was discharged with Robaxin to use conservatively mostly at bedtime and Voltaren gel for topical use.  Otherwise Tylenol.  Final Clinical Impression(s) / ED Diagnoses Final diagnoses:  Acute back pain, unspecified back location, unspecified back pain laterality  Fall, initial encounter    Rx / DC Orders ED Discharge Orders         Ordered    diclofenac Sodium (VOLTAREN) 1 % GEL  4 times daily        06/24/20 2012    methocarbamol (ROBAXIN) 500 MG tablet  2 times daily        06/24/20 2012           Solon Augusta [place], [place] 06/25/20 1421    Pollyann Savoy, MD 06/28/20 [phone]

## 2020-06-24 NOTE — ED Triage Notes (Signed)
Pt fell while going out to her car yesterday.  She mis-stepped.  Pt fell onto her back and hit back of head on plastic garbage can, no loc.  Pt also c/o left arm pain.

## 2020-06-24 NOTE — Discharge Instructions (Addendum)
Your x-ray is without any evidence of fracture.  Please use warm compresses to the area where you are having pain.  Please run plenty of water.  Please follow-up with your primary care doctor.  I recommend Tylenol 1000 mg or 6 hours for pain.  I see that you have been prescribed Robaxin before.  I have prescribed 10 additional tablets of this to use at bedtime if needed for pain.  I have also prescribed Voltaren gel which I recommend using as a topical medication as well.

## 2021-07-02 ENCOUNTER — Emergency Department (HOSPITAL_BASED_OUTPATIENT_CLINIC_OR_DEPARTMENT_OTHER): Payer: Medicare Other

## 2021-07-02 ENCOUNTER — Encounter (HOSPITAL_BASED_OUTPATIENT_CLINIC_OR_DEPARTMENT_OTHER): Payer: Self-pay

## 2021-07-02 ENCOUNTER — Emergency Department (HOSPITAL_BASED_OUTPATIENT_CLINIC_OR_DEPARTMENT_OTHER)
Admission: EM | Admit: 2021-07-02 | Discharge: 2021-07-02 | Disposition: A | Discharge status: Home / Self Care | Payer: Medicare Other | Attending: Emergency Medicine | Admitting: Emergency Medicine

## 2021-07-02 ENCOUNTER — Other Ambulatory Visit: Payer: Self-pay

## 2021-07-02 DIAGNOSIS — R918 Other nonspecific abnormal finding of lung field: Secondary | ICD-10-CM | POA: Insufficient documentation

## 2021-07-02 DIAGNOSIS — S0990XA Unspecified injury of head, initial encounter: Secondary | ICD-10-CM | POA: Diagnosis present

## 2021-07-02 DIAGNOSIS — W208XXA Other cause of strike by thrown, projected or falling object, initial encounter: Secondary | ICD-10-CM | POA: Insufficient documentation

## 2021-07-02 DIAGNOSIS — Y92009 Unspecified place in unspecified non-institutional (private) residence as the place of occurrence of the external cause: Secondary | ICD-10-CM | POA: Diagnosis not present

## 2021-07-02 DIAGNOSIS — M542 Cervicalgia: Secondary | ICD-10-CM | POA: Insufficient documentation

## 2021-07-02 NOTE — Discharge Instructions (Signed)
Follow-up with your primary care provider. ?There were some incidental groundglass opacities in the upper parts of your lung seen on imaging today.  You will just need to inform your primary care provider about this. ?Take Tylenol as needed for pain. ?Return to the ER if you start to experience worsening symptoms, severe headache, blurry vision, additional head injuries, changes to your gait or vomiting. ?

## 2021-07-02 NOTE — ED Triage Notes (Signed)
Pt reports "a panel from the window fell on my head" ~30 min PTA-no LOC-no break in skin-NAD-steady gait ?

## 2021-07-02 NOTE — ED Notes (Signed)
Ambulatory to CT scan

## 2021-07-02 NOTE — ED Provider Notes (Signed)
?[facility] ?Provider Note ? ? ?CSN: [id number] ?Arrival date & time: 07/02/21  1114 ? ?  ? ?History ? ?Chief Complaint  ?Patient presents with  ? Head Injury  ? ? ?Islam Sweetin is a 71 y.o. female presenting to the ED with a chief complaint of headache and neck pain.  States that she was at her home when a very large and heavy panel fell and hit her on the top of her head.  Reports headache and pain in her neck from this injury.  Denies any loss of consciousness, vision changes, numbness in arms or legs, changes to gait, vomiting.  States that she has not currently taking anticoagulant medications. ? ?Head Injury ?Associated symptoms: headache and neck pain   ?Associated symptoms: no nausea and no vomiting   ? ?  ? ?Home Medications ?Prior to Admission medications   ?Medication Sig Start Date End Date Taking? Authorizing Provider  ?amLODipine (NORVASC) 10 MG tablet Take 25 mg by mouth daily.    [provider]  ?diclofenac Sodium (VOLTAREN) 1 % GEL Apply 2 g topically 4 (four) times daily. 06/24/20   Tedd Sias, PA  ?hydrochlorothiazide (HYDRODIURIL) 25 MG tablet Take 25 mg by mouth daily.    [provider]  ?methocarbamol (ROBAXIN) 500 MG tablet Take 1 tablet (500 mg total) by mouth 2 (two) times daily. 06/24/20   Tedd Sias, PA  ?naproxen (NAPROSYN) 500 MG tablet Take 1 tablet (500 mg total) by mouth 2 (two) times daily. 09/10/14   Hess, Hessie Diener, PA-C  ?   ? ?Allergies    ?Hydrochlorothiazide, Lisinopril, and Codeine   ? ?Review of Systems   ?Review of Systems  ?Constitutional:  Negative for appetite change, chills and fever.  ?HENT:  Negative for ear pain, rhinorrhea, sneezing and sore throat.   ?Eyes:  Negative for photophobia and visual disturbance.  ?Respiratory:  Negative for cough, chest tightness, shortness of breath and wheezing.   ?Cardiovascular:  Negative for chest pain and palpitations.  ?Gastrointestinal:  Negative for abdominal pain, blood  in stool, constipation, diarrhea, nausea and vomiting.  ?Genitourinary:  Negative for dysuria, hematuria and urgency.  ?Musculoskeletal:  Positive for neck pain. Negative for myalgias.  ?Skin:  Negative for rash.  ?Neurological:  Positive for headaches. Negative for dizziness, weakness and light-headedness.  ? ?Physical Exam ?Updated Vital Signs ?BP (!) 176/88 (BP Location: Left Arm)   Pulse 86   Temp 99 ?F (37.2 ?C) (Oral)   Resp 18   Ht 5\' 4"  (1.626 m)   Wt 103.4 kg   SpO2 100%   BMI 39.14 kg/m?  ?Physical Exam ?Vitals and nursing note reviewed.  ?Constitutional:   ?   General: She is not in acute distress. ?   Appearance: She is well-developed.  ?HENT:  ?   Head: Normocephalic and atraumatic.  ?   Nose: Nose normal.  ?Eyes:  ?   General: No scleral icterus.    ?   Left eye: No discharge.  ?   Conjunctiva/sclera: Conjunctivae normal.  ?Neck:  ? ?   Comments: Tenderness palpation of the left paraspinal musculature of the cervical spine. ?Cardiovascular:  ?   Rate and Rhythm: Normal rate and regular rhythm.  ?   Heart sounds: Normal heart sounds. No murmur heard. ?  No friction rub. No gallop.  ?Pulmonary:  ?   Effort: Pulmonary effort is normal. No respiratory distress.  ?   Breath sounds: Normal breath sounds.  ?  Abdominal:  ?   General: Bowel sounds are normal. There is no distension.  ?   Palpations: Abdomen is soft.  ?   Tenderness: There is no abdominal tenderness. There is no guarding.  ?Musculoskeletal:     ?   General: Normal range of motion.  ?   Cervical back: Normal range of motion and neck supple.  ?Skin: ?   General: Skin is warm and dry.  ?   Findings: No rash.  ?Neurological:  ?   Mental Status: She is alert and oriented to person, place, and time.  ?   Cranial Nerves: No cranial nerve deficit.  ?   Sensory: No sensory deficit.  ?   Motor: No weakness or abnormal muscle tone.  ?   Coordination: Coordination normal.  ?   Comments: Pupils reactive. No facial asymmetry noted. Cranial nerves appear  grossly intact. Sensation intact to light touch on face, BUE and BLE. Strength 5/5 in BUE and BLE.  ? ? ?ED Results / Procedures / Treatments   ?Labs ?(all labs ordered are listed, but only abnormal results are displayed) ?Labs Reviewed - No data to display ? ?EKG ?None ? ?Radiology ?CT Head Wo Contrast ? ?Result Date: 07/02/2021 ?CLINICAL DATA:  Trauma EXAM: CT HEAD WITHOUT CONTRAST TECHNIQUE: Contiguous axial images were obtained from the base of the skull through the vertex without intravenous contrast. RADIATION DOSE REDUCTION: This exam was performed according to the departmental dose-optimization program which includes automated exposure control, adjustment of the mA and/or kV according to patient size and/or use of iterative reconstruction technique. COMPARISON:  None. FINDINGS: Brain: No acute intracranial findings are seen in noncontrast CT brain. Cortical sulci are prominent. Ventricles are not dilated. Vascular: Unremarkable. Skull: No fracture is seen in the calvarium. Sinuses/Orbits: Unremarkable. Other: None IMPRESSION: No acute intracranial findings are seen in noncontrast CT brain. Electronically Signed   By: Elmer Picker M.D.   On: 07/02/2021 13:02  ? ?CT Cervical Spine Wo Contrast ? ?Result Date: 07/02/2021 ?CLINICAL DATA:  Trauma EXAM: CT CERVICAL SPINE WITHOUT CONTRAST TECHNIQUE: Multidetector CT imaging of the cervical spine was performed without intravenous contrast. Multiplanar CT image reconstructions were also generated. RADIATION DOSE REDUCTION: This exam was performed according to the departmental dose-optimization program which includes automated exposure control, adjustment of the mA and/or kV according to patient size and/or use of iterative reconstruction technique. COMPARISON:  None. FINDINGS: Alignment: Alignment of posterior margins of vertebral bodies is unremarkable. There is reversal of lordosis. Skull base and vertebrae: No recent fracture is seen. Degenerative changes are  noted with disc space narrowing, bony spurs and facet hypertrophy. Soft tissues and spinal canal: There is no central spinal stenosis. Posterior bony spurs are causing extrinsic pressure over the ventral margin of thecal sac from C3-C6 levels. Disc levels: There is encroachment of neural foramina from C2 to C7 levels. Upper chest: There are faint ground-glass densities in the upper lung fields. Other: None IMPRESSION: No recent fracture is seen in the cervical spine. Cervical spondylosis with encroachment of neural foramina from C2-C7 levels. There are faint ground-glass densities in the upper lung fields which may suggest scarring or interstitial pneumonitis. Electronically Signed   By: Elmer Picker M.D.   On: 07/02/2021 13:06   ? ?Procedures ?Procedures  ? ? ?Medications Ordered in ED ?Medications - No data to display ? ?ED Course/ Medical Decision Making/ A&P ?  ?                        ?  Medical Decision Making ?Amount and/or Complexity of Data Reviewed ?Radiology: ordered. ? ? ?71 year old female presenting to the ED for headache and neck pain after a large wooden panel fell on her head at home.  Denies any loss of consciousness, vision changes, numbness in arms or legs.  On exam patient without any neurological deficits.  She has no numbness or weakness on exam.  No facial asymmetry.  She has normal speech.  She has normal gait.  CT scan of the head shows no acute abnormalities.  CT of the cervical spine with no acute fracture.  There are incidental findings of groundglass densities in the upper lung fields but patient not expressing any respiratory symptoms.  I informed her of this finding.  There are no headache characteristics that are lateralizing or concerning for increased ICP, infectious or vascular cause of his symptoms.   ? Patient was counseled on head injury precautions and symptoms that should indicate their return to the ED.  These include severe worsening headache, vision changes,  confusion, loss of consciousness, trouble walking, nausea & vomiting, or weakness/tingling in extremities. ?She remains hemodynamically stable.  Return precautions given ? ? ? ?Patient is hemodynamically stable, in

## 2021-10-04 ENCOUNTER — Encounter (HOSPITAL_BASED_OUTPATIENT_CLINIC_OR_DEPARTMENT_OTHER): Payer: Self-pay | Admitting: Emergency Medicine

## 2021-10-04 ENCOUNTER — Emergency Department (HOSPITAL_BASED_OUTPATIENT_CLINIC_OR_DEPARTMENT_OTHER)
Admission: EM | Admit: 2021-10-04 | Discharge: 2021-10-04 | Disposition: A | Discharge status: Home / Self Care | Payer: Medicare Other | Attending: Emergency Medicine | Admitting: Emergency Medicine

## 2021-10-04 ENCOUNTER — Other Ambulatory Visit: Payer: Self-pay

## 2021-10-04 DIAGNOSIS — Z23 Encounter for immunization: Secondary | ICD-10-CM | POA: Diagnosis not present

## 2021-10-04 DIAGNOSIS — T23241A Burn of second degree of multiple right fingers (nail), including thumb, initial encounter: Secondary | ICD-10-CM | POA: Diagnosis present

## 2021-10-04 DIAGNOSIS — X102XXA Contact with fats and cooking oils, initial encounter: Secondary | ICD-10-CM | POA: Insufficient documentation

## 2021-10-04 LAB — COMPREHENSIVE METABOLIC PANEL
ALT: 14 U/L (ref 0–44)
AST: 18 U/L (ref 15–41)
Albumin: 3.7 g/dL (ref 3.5–5.0)
Alkaline Phosphatase: 65 U/L (ref 38–126)
Anion gap: 4 — ABNORMAL LOW (ref 5–15)
BUN: 13 mg/dL (ref 8–23)
CO2: 27 mmol/L (ref 22–32)
Calcium: 8.9 mg/dL (ref 8.9–10.3)
Chloride: 108 mmol/L (ref 98–111)
Creatinine, Ser: 1.08 mg/dL — ABNORMAL HIGH (ref 0.44–1.00)
GFR, Estimated: 55 mL/min — ABNORMAL LOW (ref >60)
Glucose, Bld: 108 mg/dL — ABNORMAL HIGH (ref 70–99)
Potassium: 3.9 mmol/L (ref 3.5–5.1)
Sodium: 139 mmol/L (ref 135–145)
Total Bilirubin: 0.4 mg/dL (ref 0.3–1.2)
Total Protein: 7.7 g/dL (ref 6.5–8.1)

## 2021-10-04 LAB — CBC WITH DIFFERENTIAL/PLATELET
Abs Immature Granulocytes: 0.02 10*3/uL (ref 0.00–0.07)
Basophils Absolute: 0 10*3/uL (ref 0.0–0.1)
Basophils Relative: 1 %
Eosinophils Absolute: 0.1 10*3/uL (ref 0.0–0.5)
Eosinophils Relative: 2 %
HCT: 42.1 % (ref 36.0–46.0)
Hemoglobin: 13.6 g/dL (ref 12.0–15.0)
Immature Granulocytes: 0 %
Lymphocytes Relative: 28 %
Lymphs Abs: 1.3 10*3/uL (ref 0.7–4.0)
MCH: 26.9 pg (ref 26.0–34.0)
MCHC: 32.3 g/dL (ref 30.0–36.0)
MCV: 83.2 fL (ref 80.0–100.0)
Monocytes Absolute: 0.3 10*3/uL (ref 0.1–1.0)
Monocytes Relative: 6 %
Neutro Abs: 2.9 10*3/uL (ref 1.7–7.7)
Neutrophils Relative %: 63 %
Platelets: 281 10*3/uL (ref 150–400)
RBC: 5.06 MIL/uL (ref 3.87–5.11)
RDW: 12.7 % (ref 11.5–15.5)
WBC: 4.6 10*3/uL (ref 4.0–10.5)
nRBC: 0 % (ref 0.0–0.2)

## 2021-10-04 MED ORDER — TETANUS-DIPHTH-ACELL PERTUSSIS 5-2.5-18.5 LF-MCG/0.5 IM SUSY
0.5000 mL | PREFILLED_SYRINGE | Freq: Once | INTRAMUSCULAR | Status: AC
  Administered 2021-10-04: 0.5 mL via INTRAMUSCULAR
  Filled 2021-10-04: qty 0.5

## 2021-10-04 MED ORDER — HYDROMORPHONE HCL 1 MG/ML IJ SOLN
1.0000 mg | Freq: Once | INTRAMUSCULAR | Status: AC
  Administered 2021-10-04: 1 mg via INTRAVENOUS
  Filled 2021-10-04: qty 1

## 2021-10-04 MED ORDER — OXYCODONE-ACETAMINOPHEN 5-325 MG PO TABS: 1.0000 | ORAL_TABLET | Freq: Four times a day (QID) | ORAL | 0 refills | Status: AC | PRN

## 2021-10-04 MED ORDER — SILVER SULFADIAZINE 1 % EX CREA
TOPICAL_CREAM | Freq: Once | CUTANEOUS | Status: AC
  Filled 2021-10-04: qty 85

## 2021-10-04 NOTE — ED Triage Notes (Signed)
Reports grease burn to right hand just pta while cooking.

## 2021-10-15 ENCOUNTER — Encounter (HOSPITAL_BASED_OUTPATIENT_CLINIC_OR_DEPARTMENT_OTHER): Payer: 59 | Attending: General Surgery | Admitting: General Surgery

## 2021-10-15 DIAGNOSIS — I10 Essential (primary) hypertension: Secondary | ICD-10-CM | POA: Insufficient documentation

## 2021-10-15 DIAGNOSIS — T23201A Burn of second degree of right hand, unspecified site, initial encounter: Secondary | ICD-10-CM | POA: Insufficient documentation

## 2021-10-15 DIAGNOSIS — X102XXA Contact with fats and cooking oils, initial encounter: Secondary | ICD-10-CM | POA: Insufficient documentation

## 2021-10-15 NOTE — Progress Notes (Signed)
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 directed. Paper Tape, 2x10 (in/yd) Discharge Instruction: Secure dressing with tape as directed. Compression Wrap Compression Stockings Add-Ons Electronic Signature(s) Signed: 10/15/2021 9:19:39 AM By: Dellie Catholic RN Entered By: Dellie Catholic on 10/15/2021 08:24:13 -------------------------------------------------------------------------------- Vitals Details Patient Name: Date of Service: Emma Ortiz, NEV A DA 10/15/2021 8:00 A M Medical Record Number: [id number] Patient Account Number: [id number] Date of Birth/Sex: Treating RN: [birth year] (71 y.o. Emma Ortiz Primary Care Solita Macadam: PCP, NO Other Clinician: Referring Cybil [place]: Treating Jeran Hiltz/Extender: Westley Foots in Treatment: 0 Vital Signs Time Taken: 07:54 Temperature (F): 98.8 Height (in): 63 Pulse (bpm): 65 Source: Stated Respiratory Rate (breaths/min): 16 Weight (lbs): 238 Blood Pressure (mmHg): 133/83 Source: Stated Reference Range: 80 - 120 mg / dl Body Mass Index (BMI): 42.2 Electronic Signature(s) Signed: 10/15/2021 9:19:39 AM By: Dellie Catholic RN Entered By: Dellie Catholic on 10/15/2021 07:57:10
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
## 2021-10-15 NOTE — Progress Notes (Signed)
Emma Ortiz ([id number]) Visit Report for 10/15/2021 Abuse Risk Screen Details Patient Name: Date of Service: Emma Ortiz 10/15/2021 8:00 A M Medical Record Number: [id number] Patient Account Number: [id number] Date of Birth/Sex: Treating RN: 03/14/1951 (71 y.o. Emma Ortiz Primary Care Emma Ortiz: PCP, NO Other Clinician: Referring Emma Ortiz: Treating Malkie Wille/Extender: Emma Ortiz in Treatment: 0 Abuse Risk Screen Items Answer ABUSE RISK SCREEN: Has anyone close to you tried to hurt or harm you recentlyo No Do you feel uncomfortable with anyone in your familyo No Has anyone forced you do things that you didnt want to doo No Electronic Signature(s) Signed: 10/15/2021 9:19:39 AM By: Karie Schwalbe RN Entered By: Karie Schwalbe on 10/15/2021 08:04:11 -------------------------------------------------------------------------------- Activities of Daily Living Details Patient Name: Date of Service: Emma Ortiz 10/15/2021 8:00 A M Medical Record Number: [id number] Patient Account Number: [id number] Date of Birth/Sex: Treating RN: 1950/11/19 (71 y.o. Emma Ortiz Primary Care Aletta Ortiz: PCP, NO Other Clinician: Referring Emma Ortiz: Treating Emma Ortiz/Extender: Emma Ortiz in Treatment: 0 Activities of Daily Living Items Answer Activities of Daily Living (Please select one for each item) Drive Automobile Completely Able T Medications ake Completely Able Use T elephone Completely Able Care for Appearance Completely Able Use T oilet Completely Able Bath / Shower Completely Able Dress Self Completely Able Feed Self Completely Able Walk Completely Able Get In / Out Bed Completely Able Housework Completely Able Prepare Meals Completely Able Handle Money Completely Able Shop for Self Completely Able Electronic Signature(s) Signed: 10/15/2021 9:19:39 AM By: Karie Schwalbe RN Entered By: Karie Schwalbe  on 10/15/2021 08:04:42 -------------------------------------------------------------------------------- Education Screening Details Patient Name: Date of Service: Emma Ortiz, Emma Ortiz 10/15/2021 8:00 A M Medical Record Number: [id number] Patient Account Number: [id number] Date of Birth/Sex: Treating RN: 1950/09/15 (71 y.o. Emma Ortiz Primary Care Emma Ortiz: PCP, NO Other Clinician: Referring Emma Ortiz: Treating Emma Ortiz/Extender: Emma Ortiz in Treatment: 0 Primary Learner Assessed: Patient Learning Preferences/Education Level/Primary Language Learning Preference: Explanation, Demonstration, Printed Material Preferred Language: English Cognitive Barrier Language Barrier: No Translator Needed: No Memory Deficit: No Emotional Barrier: No Cultural/Religious Beliefs Affecting Medical Care: Yes Jehovah Witness Physical Barrier Impaired Vision: No Impaired Hearing: No Decreased Hand dexterity: No Knowledge/Comprehension Knowledge Level: High Comprehension Level: High Ability to understand written instructions: High Ability to understand verbal instructions: High Motivation Anxiety Level: Calm Cooperation: Cooperative Education Importance: Acknowledges Need Interest in Health Problems: Asks Questions Perception: Coherent Willingness to Engage in Self-Management High Activities: Readiness to Engage in Self-Management High Activities: Electronic Signature(s) Signed: 10/15/2021 9:19:39 AM By: Karie Schwalbe RN Entered By: Karie Schwalbe on 10/15/2021 08:05:31 -------------------------------------------------------------------------------- Fall Risk Assessment Details Patient Name: Date of Service: Emma Ortiz, Emma Ortiz 10/15/2021 8:00 A M Medical Record Number: [id number] Patient Account Number: [id number] Date of Birth/Sex: Treating RN: 08-14-1950 (71 y.o. Emma Ortiz Primary Care Emma Ortiz: PCP, NO Other Clinician: Referring  Emma Ortiz: Treating Emma Ortiz/Extender: Emma Ortiz in Treatment: 0 Fall Risk Assessment Items Have you had 2 or more falls in the last 12 monthso 0 No Have you had any fall that resulted in injury in the last 12 monthso 0 No FALLS RISK SCREEN History of falling - immediate or within 3 months 0 No Secondary diagnosis (Do you have 2 or more medical diagnoseso) 0 No Ambulatory aid None/bed rest/wheelchair/nurse 0 No Crutches/cane/walker 0 No Furniture 0 No Intravenous therapy Access/Saline/Heparin Lock 0 No Gait/Transferring Normal/  bed rest/ wheelchair 0 No Weak (short steps with or without shuffle, stooped but able to lift head while walking, may seek 0 No support from furniture) Impaired (short steps with shuffle, may have difficulty arising from chair, head down, impaired 0 No balance) Mental Status Oriented to own ability 0 No Electronic Signature(s) Signed: 10/15/2021 9:19:39 AM By: Karie Schwalbe RN Entered By: Karie Schwalbe on 10/15/2021 08:05:42 -------------------------------------------------------------------------------- Foot Assessment Details Patient Name: Date of Service: Emma Ortiz, Emma Ortiz 10/15/2021 8:00 A M Medical Record Number: [id number] Patient Account Number: [id number] Date of Birth/Sex: Treating RN: 03/10/1951 (71 y.o. Emma Ortiz Primary Care Emma Ortiz: PCP, NO Other Clinician: Referring Emma Ortiz: Treating Emma Ortiz/Extender: Emma Ortiz in Treatment: 0 Foot Assessment Items Site Locations + = Sensation present, - = Sensation absent, C = Callus, U = Ulcer R = Redness, W = Warmth, M = Maceration, PU = Pre-ulcerative lesion F = Fissure, S = Swelling, D = Dryness Assessment Right: Left: Other Deformity: No No Prior Foot Ulcer: No No Prior Amputation: No No Charcot Joint: No No Ambulatory Status: Ambulatory Without Help Gait: Steady Electronic Signature(s) Signed: 10/15/2021 9:19:39  AM By: Karie Schwalbe RN Entered By: Karie Schwalbe on 10/15/2021 08:06:04 -------------------------------------------------------------------------------- Nutrition Risk Screening Details Patient Name: Date of Service: Emma Ortiz 10/15/2021 8:00 A M Medical Record Number: [id number] Patient Account Number: [id number] Date of Birth/Sex: Treating RN: [birth year] (71 y.o. Emma Ortiz Primary Care Mohini Heathcock: PCP, NO Other Clinician: Referring Chrishawna Farina: Treating Samie Reasons/Extender: Emma Ortiz in Treatment: 0 Height (in): 63 Weight (lbs): 238 Body Mass Index (BMI): 42.2 Nutrition Risk Screening Items Score Screening NUTRITION RISK SCREEN: I have an illness or condition that made me change the kind and/or amount of food I eat 0 No I eat fewer than two meals per day 0 No I eat few fruits and vegetables, or milk products 0 No I have three or more drinks of beer, liquor or wine almost every day 0 No I have tooth or mouth problems that make it hard for me to eat 0 No I don't always have enough money to buy the food I need 0 No I eat alone most of the time 0 No I take three or more different prescribed or over-the-counter drugs a day 0 No Without wanting to, I have lost or gained 10 pounds in the last six months 0 No I am not always physically able to shop, cook and/or feed myself 0 No Nutrition Protocols Good Risk Protocol 0 No interventions needed Moderate Risk Protocol High Risk Proctocol Risk Level: Good Risk Score: 0 Electronic Signature(s) Signed: 10/15/2021 9:19:39 AM By: Karie Schwalbe RN Entered By: Karie Schwalbe on 10/15/2021 08:05:52

## 2021-10-24 ENCOUNTER — Encounter (HOSPITAL_BASED_OUTPATIENT_CLINIC_OR_DEPARTMENT_OTHER): Payer: 59 | Admitting: Internal Medicine

## 2021-10-24 DIAGNOSIS — T23201A Burn of second degree of right hand, unspecified site, initial encounter: Secondary | ICD-10-CM | POA: Diagnosis not present

## 2021-10-24 NOTE — Progress Notes (Signed)
Emma Ortiz, Emma Ortiz ([id number]) Visit Report for 10/24/2021 Arrival Information Details Patient Name: Date of Service: Emma Ortiz DA 10/24/2021 10:15 Ortiz M Medical Record Number: [id number] Patient Account Number: [id number] Date of Birth/Sex: Treating RN: Aug 20, 1950 (71 y.o. Emma Ortiz Primary Care Elda Dunkerson: PCP, NO Other Clinician: Referring Devina Bezold: Treating Chihiro Frey/Extender: Loren Racer in Treatment: 1 Visit Information History Since Last Visit Added or deleted any medications: No Patient Arrived: Ambulatory Any new allergies or adverse reactions: No Arrival Time: 10:41 Had Ortiz fall or experienced change in No Accompanied By: self activities of daily living that may affect Transfer Assistance: None risk of falls: Patient Identification Verified: Yes Signs or symptoms of abuse/neglect since last visito No Secondary Verification Process Completed: Yes Hospitalized since last visit: No Implantable device outside of the clinic excluding No cellular tissue based products placed in the center since last visit: Has Dressing in Place as Prescribed: Yes Pain Present Now: Yes Electronic Signature(s) Signed: 10/24/2021 12:02:35 PM By: Thayer Dallas Entered By: Thayer Dallas on 10/24/2021 10:42:47 -------------------------------------------------------------------------------- Encounter Discharge Information Details Patient Name: Date of Service: Emma Ortiz, Emma Ortiz DA 10/24/2021 10:15 Ortiz M Medical Record Number: [id number] Patient Account Number: [id number] Date of Birth/Sex: Treating RN: 03/01/1951 (71 y.o. [name] Primary Care Soo Steelman: PCP, NO Other Clinician: Referring Danella Philson: Treating Dymon Summerhill/Extender: Loren Racer in Treatment: 1 Encounter Discharge Information Items Post Procedure Vitals Discharge Condition: Stable Temperature (F): 98.4 Ambulatory Status: Ambulatory Pulse (bpm): 53 Discharge  Destination: Home Respiratory Rate (breaths/min): 16 Transportation: Private Auto Blood Pressure (mmHg): 154/78 Accompanied By: self Schedule Follow-up Appointment: Yes Clinical Summary of Care: Patient Declined Electronic Signature(s) Signed: 10/24/2021 5:02:52 PM By: Samuella Bruin Entered By: Samuella Bruin on 10/24/2021 11:36:09 -------------------------------------------------------------------------------- Lower Extremity Assessment Details Patient Name: Date of Service: Emma Ortiz Ortiz DA 10/24/2021 10:15 Ortiz M Medical Record Number: [id number] Patient Account Number: [id number] Date of Birth/Sex: Treating RN: Jun 10, 1950 (71 y.o. Emma Ortiz Primary Care Geza Beranek: PCP, NO Other Clinician: Referring Gautam Langhorst: Treating Imani Sherrin/Extender: Loren Racer in Treatment: 1 Electronic Signature(s) Signed: 10/24/2021 12:02:35 PM By: Thayer Dallas Signed: 10/24/2021 6:45:02 PM By: Karie Schwalbe RN Entered By: Thayer Dallas on 10/24/2021 10:45:54 -------------------------------------------------------------------------------- Multi Wound Chart Details Patient Name: Date of Service: Emma Ortiz, Emma Ortiz DA 10/24/2021 10:15 Ortiz M Medical Record Number: [id number] Patient Account Number: [id number] Date of Birth/Sex: Treating RN: 12/07/1950 (71 y.o. Emma Ortiz Primary Care Ralpheal Zappone: PCP, NO Other Clinician: Referring Guinn Delarosa: Treating Maddox Bratcher/Extender: Loren Racer in Treatment: 1 Vital Signs Height(in): 63 Pulse(bpm): 53 Weight(lbs): 238 Blood Pressure(mmHg): 154/78 Body Mass Index(BMI): 42.2 Temperature(F): 98.4 Respiratory Rate(breaths/min): 16 Photos: Right Hand - Web Between 1st and 2nd Right, Proximal Hand - 2nd Digit Right, Distal Hand - 2nd Digit Wound Location: Digit Thermal Burn Thermal Burn Thermal Burn Wounding Event: 2nd degree Burn 2nd degree Burn 2nd degree Burn Primary  Etiology: Osteoarthritis Osteoarthritis Osteoarthritis Comorbid History: 10/05/2021 10/05/2021 10/05/2021 Date Acquired: 1 1 1  Weeks of Treatment: Open Open Open Wound Status: No No No Wound Recurrence: 0.5x0.2x0.1 0x0x0 0x0x0 Measurements L x W x D (cm) 0.079 0 0 Ortiz (cm) : rea 0.008 0 0 Volume (cm) : 71.30% 100.00% 100.00% % Reduction in Ortiz rea: 70.40% 100.00% 100.00% % Reduction in Volume: Partial Thickness Partial Thickness Partial Thickness Classification: Medium Medium Medium Exudate Ortiz mount: Serosanguineous Serosanguineous Serosanguineous Exudate Type: red, Ortiz red, Ortiz red, Ortiz Exudate Color:  None Present (0%) None Present (0%) None Present (0%) Granulation Ortiz mount: N/Ortiz N/Ortiz N/Ortiz Granulation Quality: None Present (0%) None Present (0%) None Present (0%) Necrotic Ortiz mount: N/Ortiz N/Ortiz N/Ortiz Necrotic Tissue: Fat Layer (Subcutaneous Tissue): Yes Fat Layer (Subcutaneous Tissue): Yes Fat Layer (Subcutaneous Tissue): Yes Exposed Structures: Fascia: No Fascia: No Fascia: No Tendon: No Tendon: No Tendon: No Muscle: No Muscle: No Muscle: No Joint: No Joint: No Joint: No Bone: No Bone: No Bone: No Small (1-33%) Large (67-100%) Small (1-33%) Epithelialization: Debridement - Selective/Open Wound N/Ortiz N/Ortiz Debridement: Pre-procedure Verification/Time Out 11:11 N/Ortiz N/Ortiz Taken: Necrotic/Eschar N/Ortiz N/Ortiz Tissue Debrided: Non-Viable Tissue N/Ortiz N/Ortiz Level: 0.1 N/Ortiz N/Ortiz Debridement Ortiz (sq cm): rea Curette N/Ortiz N/Ortiz Instrument: Minimum N/Ortiz N/Ortiz Bleeding: Pressure N/Ortiz N/Ortiz Hemostasis Ortiz chieved: 0 N/Ortiz N/Ortiz Procedural Pain: 0 N/Ortiz N/Ortiz Post Procedural Pain: Procedure was tolerated well N/Ortiz N/Ortiz Debridement Treatment Response: 0.5x0.2x0.1 N/Ortiz N/Ortiz Post Debridement Measurements L x W x D (cm) 0.008 N/Ortiz N/Ortiz Post Debridement Volume: (cm) Debridement N/Ortiz N/Ortiz Procedures Performed: Wound Number: 4 N/Ortiz N/Ortiz Photos: N/Ortiz N/Ortiz Right Hand - Web Between 2nd and N/Ortiz N/Ortiz Wound  Location: 3rd Digit Thermal Burn N/Ortiz N/Ortiz Wounding Event: 2nd degree Burn N/Ortiz N/Ortiz Primary Etiology: Osteoarthritis N/Ortiz N/Ortiz Comorbid History: 10/05/2021 N/Ortiz N/Ortiz Date Ortiz cquired: 1 N/Ortiz N/Ortiz Weeks of Treatment: Open N/Ortiz N/Ortiz Wound Status: No N/Ortiz N/Ortiz Wound Recurrence: 0x0x0 N/Ortiz N/Ortiz Measurements L x W x D (cm) 0 N/Ortiz N/Ortiz Ortiz (cm) : rea 0 N/Ortiz N/Ortiz Volume (cm) : 100.00% N/Ortiz N/Ortiz % Reduction in Ortiz rea: 100.00% N/Ortiz N/Ortiz % Reduction in Volume: Partial Thickness N/Ortiz N/Ortiz Classification: Medium N/Ortiz N/Ortiz Exudate Ortiz mount: Serosanguineous N/Ortiz N/Ortiz Exudate Type: red, Ortiz N/Ortiz N/Ortiz Exudate Color: Small (1-33%) N/Ortiz N/Ortiz Granulation Ortiz mount: Red N/Ortiz N/Ortiz Granulation Quality: Large (67-100%) N/Ortiz N/Ortiz Necrotic Ortiz mount: Eschar N/Ortiz N/Ortiz Necrotic Tissue: Fat Layer (Subcutaneous Tissue): Yes N/Ortiz N/Ortiz Exposed Structures: Fascia: No Tendon: No Muscle: No Joint: No Bone: No Small (1-33%) N/Ortiz N/Ortiz Epithelialization: N/Ortiz N/Ortiz N/Ortiz Debridement: N/Ortiz N/Ortiz N/Ortiz Tissue Debrided: N/Ortiz N/Ortiz N/Ortiz Level: N/Ortiz N/Ortiz N/Ortiz Debridement Ortiz (sq cm): rea N/Ortiz N/Ortiz N/Ortiz Instrument: N/Ortiz N/Ortiz N/Ortiz Bleeding: N/Ortiz N/Ortiz N/Ortiz Hemostasis Ortiz chieved: N/Ortiz N/Ortiz N/Ortiz Procedural Pain: N/Ortiz N/Ortiz N/Ortiz Post Procedural Pain: Debridement Treatment Response: N/Ortiz N/Ortiz N/Ortiz Post Debridement Measurements L x N/Ortiz N/Ortiz N/Ortiz W x D (cm) N/Ortiz N/Ortiz N/Ortiz Post Debridement Volume: (cm) N/Ortiz N/Ortiz N/Ortiz Procedures Performed: Treatment Notes Wound #1 (Hand - Web Between 1st and 2nd Digit) Wound Laterality: Right Cleanser Soap and Water Discharge Instruction: May shower and wash wound with dial antibacterial soap and water prior to dressing change. Wound Cleanser Discharge Instruction: Cleanse the wound with wound cleanser prior to applying Ortiz clean dressing using gauze sponges, not tissue or cotton balls. Byram Ancillary Kit - 15 Day Supply Discharge Instruction: Use supplies as instructed; Kit contains: (15) Saline Bullets; (15) 3x3 Gauze; 15 pr Gloves Peri-Wound  Care Topical Silvadene Cream Discharge Instruction: Apply thin layer to wound bed only Primary Dressing Secondary Dressing Woven Gauze Sponge, Non-Sterile 4x4 in Discharge Instruction: Apply over primary dressing as directed. Secured With Conforming Stretch Gauze Bandage, Sterile 2x75 (in/in) Discharge Instruction: Secure with stretch gauze as directed. Paper Tape, 2x10 (in/yd) Discharge Instruction: Secure dressing with tape as directed. Compression Wrap Compression Stockings Add-Ons Wound #2 (Hand - 2nd Digit) Wound Laterality: Right, Proximal Cleanser Peri-Wound Care Topical Primary Dressing Secondary Dressing Secured With Compression Wrap Compression Stockings Add-Ons  Wound #3 (Hand - 2nd Digit) Wound Laterality: Right, Distal Cleanser Peri-Wound Care Topical Primary Dressing Secondary Dressing Secured With Compression Wrap Compression Stockings Add-Ons Wound #4 (Hand - Web Between 2nd and 3rd Digit) Wound Laterality: Right Cleanser Peri-Wound Care Topical Primary Dressing Secondary Dressing Secured With Compression Wrap Compression Stockings Add-Ons Electronic Signature(s) Signed: 10/24/2021 4:59:49 PM By: Linton Ham MD Signed: 10/24/2021 6:45:02 PM By: Dellie Catholic RN Entered By: Linton Ham on 10/24/2021 11:35:56 -------------------------------------------------------------------------------- Multi-Disciplinary Care Plan Details Patient Name: Date of Service: Emma Ortiz Ortiz DA 10/24/2021 10:15 Ortiz M Medical Record Number: [id number] Patient Account Number: [id number] Date of Birth/Sex: Treating RN: 07/29/1950 (71 y.o. Emma Ortiz Primary Care Harbour Nordmeyer: PCP, NO Other Clinician: Referring Sapir Lavey: Treating Stepheni Cameron/Extender: Amie [place] in Treatment: 1 Active Inactive Wound/Skin Impairment Nursing Diagnoses: Knowledge deficit related to ulceration/compromised skin  integrity Goals: Patient/caregiver will verbalize understanding of skin care regimen Date Initiated: 10/15/2021 Target Resolution Date: 12/11/2021 Goal Status: Active Interventions: Assess ulceration(s) every visit Treatment Activities: Skin care regimen initiated : 10/15/2021 Notes: Electronic Signature(s) Signed: 10/24/2021 5:02:52 PM By: Adline Peals Entered By: Adline Peals on 10/24/2021 11:10:35 -------------------------------------------------------------------------------- Pain Assessment Details Patient Name: Date of Service: Emma Ortiz Ortiz DA 10/24/2021 10:15 Ortiz M Medical Record Number: [id number] Patient Account Number: [id number] Date of Birth/Sex: Treating RN: 26-Feb-1951 (71 y.o. Emma Ortiz Primary Care Klye Besecker: PCP, NO Other Clinician: Referring Lezlie Ritchey: Treating Romar Woodrick/Extender: Amie [place] in Treatment: 1 Active Problems Location of Pain Severity and Description of Pain Patient Has Paino Yes Site Locations Rate the pain. Current Pain Level: 2 Pain Management and Medication Current Pain Management: Electronic Signature(s) Signed: 10/24/2021 12:02:35 PM By: Erenest Blank Signed: 10/24/2021 6:45:02 PM By: Dellie Catholic RN Entered By: Erenest Blank on 10/24/2021 10:45:31 -------------------------------------------------------------------------------- Patient/Caregiver Education Details Patient Name: Date of Service: Emma Ortiz Ortiz DA 7/14/2023andnbsp10:15 Ortiz M Medical Record Number: [id number] Patient Account Number: [id number] Date of Birth/Gender: Treating RN: Apr 04, 1951 (71 y.o. Emma Ortiz Primary Care Physician: PCP, NO Other Clinician: Referring Physician: Treating Physician/Extender: Amie [place] in Treatment: 1 Education Assessment Education Provided To: Patient Education Topics Provided Wound/Skin Impairment: Methods: Explain/Verbal Responses:  Reinforcements needed, State content correctly Electronic Signature(s) Signed: 10/24/2021 5:02:52 PM By: Adline Peals Entered By: Adline Peals on 10/24/2021 11:10:56 -------------------------------------------------------------------------------- Wound Assessment Details Patient Name: Date of Service: Emma Ortiz Ortiz DA 10/24/2021 10:15 Ortiz M Medical Record Number: [id number] Patient Account Number: [id number] Date of Birth/Sex: Treating RN: 1950/11/11 (71 y.o. Emma Ortiz Primary Care Michaila Kenney: PCP, NO Other Clinician: Referring Laurie Lovejoy: Treating Shaddai Shapley/Extender: Amie [place] in Treatment: 1 Wound Status Wound Number: 1 Primary Etiology: 2nd degree Burn Wound Location: Right Hand - Web Between 1st and 2nd Digit Wound Status: Open Wounding Event: Thermal Burn Comorbid History: Osteoarthritis Date Acquired: 10/05/2021 Weeks Of Treatment: 1 Clustered Wound: No Photos Wound Measurements Length: (cm) 0.5 Width: (cm) 0.2 Depth: (cm) 0.1 Area: (cm) 0.079 Volume: (cm) 0.008 % Reduction in Area: 71.3% % Reduction in Volume: 70.4% Epithelialization: Small (1-33%) Tunneling: No Undermining: No Wound Description Classification: Partial Thickness Exudate Amount: Medium Exudate Type: Serosanguineous Exudate Color: red, Ortiz Foul Odor After Cleansing: No Slough/Fibrino Yes Wound Bed Granulation Amount: None Present (0%) Exposed Structure Necrotic Amount: None Present (0%) Fascia Exposed: No Fat Layer (Subcutaneous Tissue) Exposed: Yes Tendon Exposed: No Muscle Exposed: No Joint Exposed: No Bone Exposed: No Treatment Notes Wound #1 (  Hand - Web Between 1st and 2nd Digit) Wound Laterality: Right Cleanser Soap and Water Discharge Instruction: May shower and wash wound with dial antibacterial soap and water prior to dressing change. Wound Cleanser Discharge Instruction: Cleanse the wound with wound cleanser prior to  applying Ortiz clean dressing using gauze sponges, not tissue or cotton balls. Byram Ancillary Kit - 15 Day Supply Discharge Instruction: Use supplies as instructed; Kit contains: (15) Saline Bullets; (15) 3x3 Gauze; 15 pr Gloves Peri-Wound Care Topical Silvadene Cream Discharge Instruction: Apply thin layer to wound bed only Primary Dressing Secondary Dressing Woven Gauze Sponge, Non-Sterile 4x4 in Discharge Instruction: Apply over primary dressing as directed. Secured With Conforming Stretch Gauze Bandage, Sterile 2x75 (in/in) Discharge Instruction: Secure with stretch gauze as directed. Paper Tape, 2x10 (in/yd) Discharge Instruction: Secure dressing with tape as directed. Compression Wrap Compression Stockings Add-Ons Electronic Signature(s) Signed: 10/24/2021 5:02:52 PM By: Adline Peals Entered By: Adline Peals on 10/24/2021 11:12:17 -------------------------------------------------------------------------------- Wound Assessment Details Patient Name: Date of Service: Emma Ortiz Ortiz DA 10/24/2021 10:15 Ortiz M Medical Record Number: [id number] Patient Account Number: [id number] Date of Birth/Sex: Treating RN: [birth year] (71 y.o. Emma Ortiz Primary Care Noelle Sease: PCP, NO Other Clinician: Referring Captola Teschner: Treating Marda Breidenbach/Extender: Amie [place] in Treatment: 1 Wound Status Wound Number: 2 Primary Etiology: 2nd degree Burn Wound Location: Right, Proximal Hand - 2nd Digit Wound Status: Open Wounding Event: Thermal Burn Comorbid History: Osteoarthritis Date Acquired: 10/05/2021 Weeks Of Treatment: 1 Clustered Wound: No Photos Wound Measurements Length: (cm) Width: (cm) Depth: (cm) Area: (cm) Volume: (cm) 0 % Reduction in Area: 100% 0 % Reduction in Volume: 100% 0 Epithelialization: Large (67-100%) 0 Tunneling: No 0 Undermining: No Wound Description Classification: Partial Thickness Exudate Amount: Medium Exudate  Type: Serosanguineous Exudate Color: red, Ortiz Foul Odor After Cleansing: No Slough/Fibrino Yes Wound Bed Granulation Amount: None Present (0%) Exposed Structure Necrotic Amount: None Present (0%) Fascia Exposed: No Fat Layer (Subcutaneous Tissue) Exposed: Yes Tendon Exposed: No Muscle Exposed: No Joint Exposed: No Bone Exposed: No Electronic Signature(s) Signed: 10/24/2021 12:02:35 PM By: Erenest Blank Signed: 10/24/2021 6:45:02 PM By: Dellie Catholic RN Entered By: Erenest Blank on 10/24/2021 10:57:40 -------------------------------------------------------------------------------- Wound Assessment Details Patient Name: Date of Service: Emma Ortiz, Emma Ortiz DA 10/24/2021 10:15 Ortiz M Medical Record Number: [id number] Patient Account Number: [id number] Date of Birth/Sex: Treating RN: Jul 19, 1950 (71 y.o. Emma Ortiz Primary Care Marjean Imperato: PCP, NO Other Clinician: Referring Randle Shatzer: Treating Sherlock Nancarrow/Extender: Amie [place] in Treatment: 1 Wound Status Wound Number: 3 Primary Etiology: 2nd degree Burn Wound Location: Right, Distal Hand - 2nd Digit Wound Status: Open Wounding Event: Thermal Burn Comorbid History: Osteoarthritis Date Acquired: 10/05/2021 Weeks Of Treatment: 1 Clustered Wound: No Photos Wound Measurements Length: (cm) Width: (cm) Depth: (cm) Area: (cm) Volume: (cm) 0 % Reduction in Area: 100% 0 % Reduction in Volume: 100% 0 Epithelialization: Small (1-33%) 0 Tunneling: No 0 Undermining: No Wound Description Classification: Partial Thickness Exudate Amount: Medium Exudate Type: Serosanguineous Exudate Color: red, Ortiz Foul Odor After Cleansing: No Slough/Fibrino Yes Wound Bed Granulation Amount: None Present (0%) Exposed Structure Necrotic Amount: None Present (0%) Fascia Exposed: No Fat Layer (Subcutaneous Tissue) Exposed: Yes Tendon Exposed: No Muscle Exposed: No Joint Exposed: No Bone Exposed:  No Electronic Signature(s) Signed: 10/24/2021 12:02:35 PM By: Erenest Blank Signed: 10/24/2021 6:45:02 PM By: Dellie Catholic RN Entered By: Erenest Blank on 10/24/2021 11:00:30 -------------------------------------------------------------------------------- Wound Assessment Details Patient Name: Date of Service: Emma Needy  Matilde Ortiz Ortiz DA 10/24/2021 10:15 Ortiz M Medical Record Number: [id number] Patient Account Number: [id number] Date of Birth/Sex: Treating RN: 04-13-1951 (71 y.o. Emma Ortiz Primary Care Elidia Bonenfant: PCP, NO Other Clinician: Referring Melvin Whiteford: Treating Sabine Tenenbaum/Extender: Amie [place] in Treatment: 1 Wound Status Wound Number: 4 Primary Etiology: 2nd degree Burn Wound Location: Right Hand - Web Between 2nd and 3rd Digit Wound Status: Open Wounding Event: Thermal Burn Comorbid History: Osteoarthritis Date Acquired: 10/05/2021 Weeks Of Treatment: 1 Clustered Wound: No Photos Wound Measurements Length: (cm) Width: (cm) Depth: (cm) Area: (cm) Volume: (cm) 0 % Reduction in Area: 100% 0 % Reduction in Volume: 100% 0 Epithelialization: Small (1-33%) 0 Tunneling: No 0 Undermining: No Wound Description Classification: Partial Thickness Exudate Amount: Medium Exudate Type: Serosanguineous Exudate Color: red, Ortiz Foul Odor After Cleansing: No Slough/Fibrino Yes Wound Bed Granulation Amount: Small (1-33%) Exposed Structure Granulation Quality: Red Fascia Exposed: No Necrotic Amount: Large (67-100%) Fat Layer (Subcutaneous Tissue) Exposed: Yes Necrotic Quality: Eschar Tendon Exposed: No Muscle Exposed: No Joint Exposed: No Bone Exposed: No Electronic Signature(s) Signed: 10/24/2021 5:02:52 PM By: Adline Peals Entered By: Adline Peals on 10/24/2021 11:12:18 -------------------------------------------------------------------------------- Vitals Details Patient Name: Date of Service: Emma Ortiz, Emma Ortiz DA  10/24/2021 10:15 Ortiz M Medical Record Number: [id number] Patient Account Number: [id number] Date of Birth/Sex: Treating RN: 11/04/1950 (71 y.o. Emma Ortiz Primary Care Ursala Cressy: PCP, NO Other Clinician: Referring Edwar Coe: Treating Lynlee Stratton/Extender: Amie [place] in Treatment: 1 Vital Signs Time Taken: 10:42 Temperature (F): 98.4 Height (in): 63 Pulse (bpm): 53 Weight (lbs): 238 Respiratory Rate (breaths/min): 16 Body Mass Index (BMI): 42.2 Blood Pressure (mmHg): 154/78 Reference Range: 80 - 120 mg / dl Electronic Signature(s) Signed: 10/24/2021 12:02:35 PM By: Erenest Blank Entered By: Erenest Blank on 10/24/2021 10:45:18
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
## 2021-10-29 ENCOUNTER — Encounter (HOSPITAL_BASED_OUTPATIENT_CLINIC_OR_DEPARTMENT_OTHER): Payer: 59 | Admitting: General Surgery

## 2021-10-29 DIAGNOSIS — T23201A Burn of second degree of right hand, unspecified site, initial encounter: Secondary | ICD-10-CM | POA: Diagnosis not present
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
## 2021-10-31 ENCOUNTER — Encounter (HOSPITAL_BASED_OUTPATIENT_CLINIC_OR_DEPARTMENT_OTHER): Payer: 59 | Admitting: General Surgery

## 2021-12-15 ENCOUNTER — Encounter (HOSPITAL_BASED_OUTPATIENT_CLINIC_OR_DEPARTMENT_OTHER): Payer: Self-pay | Admitting: Emergency Medicine

## 2021-12-15 ENCOUNTER — Emergency Department (HOSPITAL_BASED_OUTPATIENT_CLINIC_OR_DEPARTMENT_OTHER)
Admission: EM | Admit: 2021-12-15 | Discharge: 2021-12-15 | Disposition: A | Discharge status: Home / Self Care | Payer: Medicare Other | Attending: Emergency Medicine | Admitting: Emergency Medicine

## 2021-12-15 ENCOUNTER — Other Ambulatory Visit: Payer: Self-pay

## 2021-12-15 DIAGNOSIS — Z20822 Contact with and (suspected) exposure to covid-19: Secondary | ICD-10-CM | POA: Diagnosis not present

## 2021-12-15 DIAGNOSIS — J029 Acute pharyngitis, unspecified: Secondary | ICD-10-CM | POA: Diagnosis not present

## 2021-12-15 LAB — RESP PANEL BY RT-PCR (FLU A&B, COVID) ARPGX2
Influenza A by PCR: NEGATIVE
Influenza B by PCR: NEGATIVE
SARS Coronavirus 2 by RT PCR: NEGATIVE

## 2021-12-15 LAB — GROUP A STREP BY PCR: Group A Strep by PCR: NOT DETECTED

## 2021-12-15 MED ORDER — CEPACOL REGULAR STRENGTH 3 MG MT LOZG: 1.0000 | LOZENGE | OROMUCOSAL | 12 refills | Status: AC | PRN

## 2021-12-15 NOTE — ED Provider Notes (Signed)
[facility] EMERGENCY DEPARTMENT Provider Note   CSN: [id number] Arrival date & time: 12/15/21  1950     History  Chief Complaint  Patient presents with   Sore Throat     x    Emma Ortiz is a 71 y.o. female.  With past medical history of hypertension who presents to the emergency department with sore throat.  States that symptoms began 4 days ago.  She describes having sore throat and pain with swallowing.  She denies having difficulty swallowing.  Denies having cough, congestion, shortness of breath, chest pain or fevers.  She has not had any body aches, chills or nausea or vomiting.  Denies sick contacts     Sore Throat       Home Medications Prior to Admission medications   Medication Sig Start Date End Date Taking? Authorizing Provider  menthol-cetylpyridinium (CEPACOL REGULAR STRENGTH) 3 MG lozenge Take 1 lozenge (3 mg total) by mouth as needed for sore throat. 12/15/21  Yes Cristopher Peru, PA-C  amLODipine (NORVASC) 10 MG tablet Take 25 mg by mouth daily.    [provider]  diclofenac Sodium (VOLTAREN) 1 % GEL Apply 2 g topically 4 (four) times daily. 06/24/20   Gailen Shelter, PA  hydrochlorothiazide (HYDRODIURIL) 25 MG tablet Take 25 mg by mouth daily.    [provider]  methocarbamol (ROBAXIN) 500 MG tablet Take 1 tablet (500 mg total) by mouth 2 (two) times daily. 06/24/20   Gailen Shelter, PA  naproxen (NAPROSYN) 500 MG tablet Take 1 tablet (500 mg total) by mouth 2 (two) times daily. 09/10/14   Hess, Nada Boozer, PA-C  oxyCODONE-acetaminophen (PERCOCET) 5-325 MG tablet Take 1 tablet by mouth every 6 (six) hours as needed. 10/04/21   Charlynne Pander, MD      Allergies    Hydrochlorothiazide, Lisinopril, and Codeine    Review of Systems   Review of Systems  HENT:  Positive for sore throat.   All other systems reviewed and are negative.   Physical Exam Updated Vital Signs BP (!) 147/92 (BP Location: Right Arm)   Pulse 66    Temp 98.3 F (36.8 C) (Oral)   Resp 18   Ht 5\' 4"  (1.626 m)   Wt 104.3 kg   SpO2 100%   BMI 39.48 kg/m  Physical Exam Vitals and nursing note reviewed.  Constitutional:      General: She is not in acute distress.    Appearance: She is well-developed. She is not ill-appearing or toxic-appearing.  HENT:     Head: Normocephalic and atraumatic.     Nose: No congestion or rhinorrhea.     Mouth/Throat:     Mouth: Mucous membranes are moist.     Pharynx: Uvula midline. Posterior oropharyngeal erythema present. No oropharyngeal exudate or uvula swelling.     Tonsils: No tonsillar exudate or tonsillar abscesses. 0 on the right. 0 on the left.  Eyes:     General: No scleral icterus.    Conjunctiva/sclera: Conjunctivae normal.     Pupils: Pupils are equal, round, and reactive to light.  Cardiovascular:     Heart sounds: Normal heart sounds. No murmur heard. Pulmonary:     Effort: Pulmonary effort is normal. No respiratory distress.  Abdominal:     Palpations: Abdomen is soft.  Musculoskeletal:     Cervical back: Normal range of motion and neck supple.  Skin:    Capillary Refill: Capillary refill takes less than 2 seconds.  Findings: No rash.  Neurological:     General: No focal deficit present.     Mental Status: She is alert and oriented to person, place, and time.  Psychiatric:        Mood and Affect: Mood normal.        Behavior: Behavior normal.        Thought Content: Thought content normal.        Judgment: Judgment normal.    ED Results / Procedures / Treatments   Labs (all labs ordered are listed, but only abnormal results are displayed) Labs Reviewed  GROUP A STREP BY PCR  RESP PANEL BY RT-PCR (FLU A&B, COVID) [id number]   EKG None  Radiology No results found.  Procedures Procedures   Medications Ordered in ED Medications - No data to display  ED Course/ Medical Decision Making/ A&P                           Medical Decision Making Risk OTC  drugs.   This patient presents to the ED with chief complaint(s) of sore throat with pertinent past medical history of hypertension which further complicates the presenting complaint. The complaint involves an extensive differential diagnosis and also carries with it a high risk of complications and morbidity.    The differential diagnosis includes strep throat, COVID or flu, viral pharyngitis, RPA, PTA, Ludwig's angina, EBV, bacterial tracheitis, epiglottitis  Additional history obtained: Additional history obtained from  none available Records reviewed Care Everywhere/External Records  ED Course and Reassessment: 71 year old female who presents to the emergency department with 4 days of sore throat. Her oropharynx has mild erythema without tonsillar swelling or exudate.  There is no airway compromise.  There is no trismus present.  Her lungs are clear bilaterally with no stridor or respiratory distress.  There is no evidence of RPA, PTA.  Her symptoms are inconsistent with Ludwig's angina. Testing here is negative for strep, COVID or flu.  Her symptoms are most consistent with a viral pharyngitis.  She is tolerating p.o. food and liquids without difficulty.  We will give her prescription for Cepacol lozenges.  Instructed use warm water with honey, lozenges, ice, Tylenol and Motrin for symptoms.  She verbalized understanding.  Given return precautions if she is unable to swallow liquids.  Independent labs interpretation:  The following labs were independently interpreted: COVID, flu and strep negative  Independent visualization of imaging: Not indicated  Consultation: - Consulted or discussed management/test interpretation w/ external professional: Not indicated  Consideration for admission or further workup: Not indicated Social Determinants of health: None identified Final Clinical Impression(s) / ED Diagnoses Final diagnoses:  Viral pharyngitis    Rx / DC Orders ED Discharge  Orders          Ordered    menthol-cetylpyridinium (CEPACOL REGULAR STRENGTH) 3 MG lozenge  As needed        12/15/21 2147              Cristopher Peru, PA-C 12/15/21 2228    Mardene Sayer, MD 12/16/21 0005

## 2021-12-15 NOTE — Discharge Instructions (Addendum)
You were seen in the emergency department today for sore throat.  You do not have COVID, flu or the strep throat.  You may just have a viral upper respiratory infection.  I provided you with some lozenges to help numb your throat.  You may also use warm tea with honey, ice cubes, Tylenol and Motrin.  Please return if you have worsening symptoms or inability to swallow liquids.

## 2021-12-15 NOTE — ED Triage Notes (Signed)
Patient arrived via POV c/o sore throat x 4 days. Patient denies N/V/D and cough. Patient states 6/10 pain. Patient is AO x 4, VS WDL, normal gait.

## 2023-05-29 ENCOUNTER — Encounter (HOSPITAL_BASED_OUTPATIENT_CLINIC_OR_DEPARTMENT_OTHER): Payer: Self-pay | Admitting: Emergency Medicine

## 2023-05-29 ENCOUNTER — Other Ambulatory Visit: Payer: Self-pay

## 2023-05-29 ENCOUNTER — Emergency Department (HOSPITAL_BASED_OUTPATIENT_CLINIC_OR_DEPARTMENT_OTHER)
Admission: EM | Admit: 2023-05-29 | Discharge: 2023-05-29 | Disposition: A | Discharge status: Home / Self Care | Payer: 59 | Attending: Emergency Medicine | Admitting: Emergency Medicine

## 2023-05-29 ENCOUNTER — Emergency Department (HOSPITAL_BASED_OUTPATIENT_CLINIC_OR_DEPARTMENT_OTHER): Payer: 59

## 2023-05-29 DIAGNOSIS — I159 Secondary hypertension, unspecified: Secondary | ICD-10-CM | POA: Insufficient documentation

## 2023-05-29 DIAGNOSIS — I10 Essential (primary) hypertension: Secondary | ICD-10-CM | POA: Diagnosis present

## 2023-05-29 LAB — BASIC METABOLIC PANEL
Anion gap: 9 (ref 5–15)
BUN: 9 mg/dL (ref 8–23)
CO2: 25 mmol/L (ref 22–32)
Calcium: 9 mg/dL (ref 8.9–10.3)
Chloride: 103 mmol/L (ref 98–111)
Creatinine, Ser: 0.87 mg/dL (ref 0.44–1.00)
GFR, Estimated: >60 mL/min (ref >60)
Glucose, Bld: 106 mg/dL — ABNORMAL HIGH (ref 70–99)
Potassium: 3.8 mmol/L (ref 3.5–5.1)
Sodium: 137 mmol/L (ref 135–145)

## 2023-05-29 LAB — CBC WITH DIFFERENTIAL/PLATELET
Abs Immature Granulocytes: 0.02 10*3/uL (ref 0.00–0.07)
Basophils Absolute: 0 10*3/uL (ref 0.0–0.1)
Basophils Relative: 1 %
Eosinophils Absolute: 0 10*3/uL (ref 0.0–0.5)
Eosinophils Relative: 1 %
HCT: 39.4 % (ref 36.0–46.0)
Hemoglobin: 12.8 g/dL (ref 12.0–15.0)
Immature Granulocytes: 0 %
Lymphocytes Relative: 33 %
Lymphs Abs: 1.7 10*3/uL (ref 0.7–4.0)
MCH: 26.7 pg (ref 26.0–34.0)
MCHC: 32.5 g/dL (ref 30.0–36.0)
MCV: 82.1 fL (ref 80.0–100.0)
Monocytes Absolute: 0.4 10*3/uL (ref 0.1–1.0)
Monocytes Relative: 8 %
Neutro Abs: 3.1 10*3/uL (ref 1.7–7.7)
Neutrophils Relative %: 57 %
Platelets: 275 10*3/uL (ref 150–400)
RBC: 4.8 MIL/uL (ref 3.87–5.11)
RDW: 12.3 % (ref 11.5–15.5)
WBC: 5.3 10*3/uL (ref 4.0–10.5)
nRBC: 0 % (ref 0.0–0.2)

## 2023-05-29 LAB — TROPONIN I (HIGH SENSITIVITY): Troponin I (High Sensitivity): 4 ng/L (ref <18)

## 2023-05-29 MED ORDER — AMLODIPINE BESYLATE 5 MG PO TABS: 5.0000 mg | ORAL_TABLET | Freq: Every day | ORAL | 1 refills | Status: AC

## 2023-05-29 MED ORDER — OXYCODONE HCL 5 MG PO TABS: 5.0000 mg | ORAL_TABLET | Freq: Once | ORAL | Status: DC

## 2023-05-29 MED ORDER — CYCLOBENZAPRINE HCL 5 MG PO TABS
5.0000 mg | ORAL_TABLET | Freq: Once | ORAL | Status: AC
Start: 2023-05-29 — End: 2023-05-29
  Administered 2023-05-29: 5 mg via ORAL
  Filled 2023-05-29: qty 1

## 2023-05-29 MED ORDER — AMLODIPINE BESYLATE 5 MG PO TABS
5.0000 mg | ORAL_TABLET | Freq: Once | ORAL | Status: AC
  Administered 2023-05-29: 5 mg via ORAL
  Filled 2023-05-29: qty 1

## 2023-05-29 NOTE — ED Triage Notes (Signed)
 Pt sts she was having "muscle spasms or arthritis" in her back tonight and this prompted her to check BP; BP elevated at home; reports she is compliant with meds

## 2023-05-29 NOTE — ED Provider Notes (Signed)
  EMERGENCY DEPARTMENT AT [facility] Provider Note   CSN: [id number] Arrival date & time: 05/29/23  2104     History No chief complaint on file.   HPI Emma Ortiz is a 73 y.o. female presenting for chief complaint of elevated blood pressure readings.  States that she has been noncompliant with her low-sodium diet over the past 4 days.  Substantial discrepancies in her sodium intake. States her blood pressure was in the 200s at home today comes in for further care management.  Denies any symptoms.  Denies fevers chills nausea vomiting syncope shortness of breath..   Patient's recorded medical, surgical, social, medication list and allergies were reviewed in the Snapshot window as part of the initial history.   Review of Systems   Review of Systems  Constitutional:  Negative for chills and fever.  HENT:  Negative for ear pain and sore throat.   Eyes:  Negative for pain and visual disturbance.  Respiratory:  Positive for shortness of breath. Negative for cough.   Cardiovascular:  Positive for chest pain. Negative for palpitations.  Gastrointestinal:  Negative for abdominal pain and vomiting.  Genitourinary:  Negative for dysuria and hematuria.  Musculoskeletal:  Negative for arthralgias and back pain.  Skin:  Negative for color change and rash.  Neurological:  Negative for seizures and syncope.  All other systems reviewed and are negative.   Physical Exam Updated Vital Signs BP (!) 203/79   Pulse 65   Temp 98.4 F (36.9 C) (Oral)   Resp 17   Ht 5\' 3"  (1.6 m)   Wt 108 kg   SpO2 100%   BMI 42.16 kg/m  Physical Exam Vitals and nursing note reviewed.  Constitutional:      General: She is not in acute distress.    Appearance: She is well-developed.  HENT:     Head: Normocephalic and atraumatic.  Eyes:     Conjunctiva/sclera: Conjunctivae normal.  Cardiovascular:     Rate and Rhythm: Normal rate and regular rhythm.     Heart sounds: No murmur  heard. Pulmonary:     Effort: Pulmonary effort is normal. No respiratory distress.     Breath sounds: Normal breath sounds.  Abdominal:     General: There is no distension.     Palpations: Abdomen is soft.     Tenderness: There is no abdominal tenderness. There is no right CVA tenderness or left CVA tenderness.  Musculoskeletal:        General: No swelling or tenderness. Normal range of motion.     Cervical back: Neck supple.  Skin:    General: Skin is warm and dry.  Neurological:     General: No focal deficit present.     Mental Status: She is alert and oriented to person, place, and time. Mental status is at baseline.     Cranial Nerves: No cranial nerve deficit.      ED Course/ Medical Decision Making/ A&P    Procedures Procedures   Medications Ordered in ED Medications  oxyCODONE (Oxy IR/ROXICODONE) immediate release tablet 5 mg (5 mg Oral Not Given 05/29/23 2133)  amLODipine (NORVASC) tablet 5 mg (5 mg Oral Given 05/29/23 2153)   Medical Decision Making:   Emma Ortiz is a 73 y.o. female who presented to the ED today with ebp detailed above.    Patient placed on continuous vitals and telemetry monitoring while in ED which was reviewed periodically.  Complete initial physical exam performed, notably the patient  was hemodynamically stable no acute distress.  Hypertensive on arrival..    Reviewed and confirmed nursing documentation for past medical history, family history, social history.    Initial Assessment:   With the patient's presentation of elevated blood pressure readings, most likely diagnosis is hypertensive urgency. Other diagnoses associated with hypertensive emergency were considered including (but not limited to) intracranial hemorrhage, acute renal artery stenosis, acute kidney injury, myocardial stress, ophthalmologic emergencies. These are considered less likely due to history of present illness and physical exam findings.   This is most consistent with an  acute life/limb threatening illness complicated by underlying chronic conditions. Will evaluate for hypertensive emergency as below.  Initial Plan:  Screening labs including CBC and Metabolic panel to evaluate for infectious or metabolic etiology of disease.  CXR to evaluate for structural/infectious intrathoracic pathology.  Troponin/EKG to evaluate for cardiac pathology. Objective evaluation as below reviewed. Considered further administration of antihypertensives in ED, per consensus guidelines for [facility] of emergency physicians, acute treatment of hypertensive urgency alone in the emergency department is not recommended.  If patient has evidence of hypertensive emergency on objective laboratory evaluation, will reevaluate.  Will monitor blood pressure while patient awaiting above laboratory studies.  Initial Study Results:   Laboratory  All laboratory results reviewed without evidence of clinically relevant pathology.    EKG EKG was reviewed independently. Rate, rhythm, axis, intervals all examined and without medically relevant abnormality. ST segments without concerns for elevations.    Radiology:  All images reviewed independently. Agree with radiology report at this time.   DG Chest Portable 1 View Result Date: 05/29/2023 CLINICAL DATA:  chest/back pain EXAM: PORTABLE CHEST 1 VIEW COMPARISON:  Chest x-ray 03/14/2015 FINDINGS: The heart and mediastinal contours are within normal limits. Atherosclerotic plaque. No focal consolidation. No pulmonary edema. No pleural effusion. No pneumothorax. No acute osseous abnormality. IMPRESSION: 1. No active disease. 2.  Aortic Atherosclerosis (ICD10-I70.0). Electronically Signed   By: Tish Frederickson M.D.   On: 05/29/2023 21:34   Reassessment and Plan:   Patient has no evidence of acute hypertensive emergency on her blood work. Will restart her on her amlodipine that she is to be on until she follows up with her primary care provider in  the outpatient setting.  Disposition:  I have considered need for hospitalization, however, considering all of the above, I believe this patient is stable for discharge at this time.  Patient/family educated about specific return precautions for given chief complaint and symptoms.  Patient/family educated about follow-up with PCP.     Patient/family expressed understanding of return precautions and need for follow-up. Patient spoken to regarding all imaging and laboratory results and appropriate follow up for these results. All education provided in verbal form with additional information in written form. Time was allowed for answering of patient questions. Patient discharged.    Emergency Department Medication Summary:   Medications  cyclobenzaprine (FLEXERIL) tablet 5 mg (has no administration in time range)  amLODipine (NORVASC) tablet 5 mg (5 mg Oral Given 05/29/23 2153)         Clinical Impression:  1. Secondary hypertension      Data Unavailable   Final Clinical Impression(s) / ED Diagnoses Final diagnoses:  Secondary hypertension    Rx / DC Orders ED Discharge Orders          Ordered    amLODipine (NORVASC) 5 MG tablet  Daily        05/29/23 2127  Glyn Ade, MD 05/29/23 2250

## 2023-05-29 NOTE — ED Notes (Signed)
 RN assumed care of pt, found her alert and oriented in bed. She stated that her blood pressure is up "probably b/c I have been eating some snacks I should not have been.  I don't do it all the time but have lately."  No other complaints.  Denies headaches or other complaints.

## 2023-07-16 ENCOUNTER — Emergency Department (HOSPITAL_BASED_OUTPATIENT_CLINIC_OR_DEPARTMENT_OTHER)
Admission: EM | Admit: 2023-07-16 | Discharge: 2023-07-16 | Disposition: A | Discharge status: Home / Self Care | Attending: Emergency Medicine | Admitting: Emergency Medicine

## 2023-07-16 ENCOUNTER — Emergency Department (HOSPITAL_BASED_OUTPATIENT_CLINIC_OR_DEPARTMENT_OTHER)

## 2023-07-16 ENCOUNTER — Other Ambulatory Visit: Payer: Self-pay

## 2023-07-16 ENCOUNTER — Encounter (HOSPITAL_BASED_OUTPATIENT_CLINIC_OR_DEPARTMENT_OTHER): Payer: Self-pay | Admitting: *Deleted

## 2023-07-16 DIAGNOSIS — M549 Dorsalgia, unspecified: Secondary | ICD-10-CM | POA: Diagnosis not present

## 2023-07-16 DIAGNOSIS — I10 Essential (primary) hypertension: Secondary | ICD-10-CM | POA: Insufficient documentation

## 2023-07-16 DIAGNOSIS — M25552 Pain in left hip: Secondary | ICD-10-CM | POA: Diagnosis not present

## 2023-07-16 DIAGNOSIS — W01198A Fall on same level from slipping, tripping and stumbling with subsequent striking against other object, initial encounter: Secondary | ICD-10-CM | POA: Insufficient documentation

## 2023-07-16 DIAGNOSIS — M542 Cervicalgia: Secondary | ICD-10-CM | POA: Diagnosis not present

## 2023-07-16 DIAGNOSIS — S0990XA Unspecified injury of head, initial encounter: Secondary | ICD-10-CM | POA: Diagnosis present

## 2023-07-16 DIAGNOSIS — Z79899 Other long term (current) drug therapy: Secondary | ICD-10-CM | POA: Insufficient documentation

## 2023-07-16 DIAGNOSIS — W19XXXA Unspecified fall, initial encounter: Secondary | ICD-10-CM

## 2023-07-16 DIAGNOSIS — Y92000 Kitchen of unspecified non-institutional (private) residence as  the place of occurrence of the external cause: Secondary | ICD-10-CM | POA: Insufficient documentation

## 2023-07-16 MED ORDER — METHOCARBAMOL 500 MG PO TABS: 500.0000 mg | ORAL_TABLET | Freq: Two times a day (BID) | ORAL | 0 refills | Status: AC

## 2023-07-16 MED ORDER — LIDOCAINE 5 % EX PTCH: 1.0000 | MEDICATED_PATCH | CUTANEOUS | 0 refills | Status: AC

## 2023-07-16 MED ORDER — ACETAMINOPHEN 500 MG PO TABS
1000.0000 mg | ORAL_TABLET | Freq: Once | ORAL | Status: AC
  Administered 2023-07-16: 1000 mg via ORAL
  Filled 2023-07-16: qty 2

## 2023-07-16 NOTE — ED Provider Notes (Signed)
 [place] EMERGENCY DEPARTMENT AT [facility] Provider Note   CSN: [id number] Arrival date & time: 07/16/23  4098     History  Chief Complaint  Patient presents with   [facility] is a 73 y.o. female.  73 year old female presents today for concern of fall that occurred just prior to arrival.  She states she was outside talking to the neighbor and that she returned into the house she tripped causing her to fall forward.  She states the head of the injury door she has her kitchen door and she believes she struck her face onto the kitchen door face first and then fell onto her hands and knees.  She denies loss of consciousness, nausea, vomiting, vision change.  She is endorsing some neck pain, back pain, and left hip pain.  The history is provided by the patient. No language interpreter was used.       Home Medications Prior to Admission medications   Medication Sig Start Date End Date Taking? Authorizing Provider  amLODipine (NORVASC) 5 MG tablet Take 1 tablet (5 mg total) by mouth daily. 05/29/23   Glyn Ade, MD  diclofenac Sodium (VOLTAREN) 1 % GEL Apply 2 g topically 4 (four) times daily. 06/24/20   Gailen Shelter, PA  hydrochlorothiazide (HYDRODIURIL) 25 MG tablet Take 25 mg by mouth daily.    [provider]  irbesartan (AVAPRO) 75 MG tablet Take 1 tablet by mouth daily. 12/02/16   [provider]  menthol-cetylpyridinium (CEPACOL REGULAR STRENGTH) 3 MG lozenge Take 1 lozenge (3 mg total) by mouth as needed for sore throat. 12/15/21   Cristopher Peru, PA-C  methocarbamol (ROBAXIN) 500 MG tablet Take 1 tablet (500 mg total) by mouth 2 (two) times daily. 06/24/20   Gailen Shelter, PA  metoprolol succinate (TOPROL-XL) 50 MG 24 hr tablet Take 50 mg by mouth daily.    [provider]  naproxen (NAPROSYN) 500 MG tablet Take 1 tablet (500 mg total) by mouth 2 (two) times daily. 09/10/14   Hess, Nada Boozer, PA-C  oxyCODONE-acetaminophen  (PERCOCET) 5-325 MG tablet Take 1 tablet by mouth every 6 (six) hours as needed. 10/04/21   Charlynne Pander, MD      Allergies    Hydrochlorothiazide, Lisinopril, and Codeine    Review of Systems   Review of Systems  Constitutional:  Negative for chills and fever.  Musculoskeletal:  Positive for arthralgias, back pain and neck pain.  Neurological:  Negative for syncope.  All other systems reviewed and are negative.   Physical Exam Updated Vital Signs BP (!) 153/85   Pulse 69   Temp 98.3 F (36.8 C) (Oral)   Resp 18   SpO2 100%  Physical Exam Vitals and nursing note reviewed.  Constitutional:      General: She is not in acute distress.    Appearance: Normal appearance. She is not ill-appearing.  HENT:     Head: Normocephalic and atraumatic.     Nose: Nose normal.  Eyes:     Conjunctiva/sclera: Conjunctivae normal.  Cardiovascular:     Rate and Rhythm: Normal rate.  Pulmonary:     Effort: Pulmonary effort is normal. No respiratory distress.  Musculoskeletal:        General: No deformity. Normal range of motion.     Comments: C-collar in place.  Thoracic lumbar spine without tenderness to palpation.  Good range of motion in bilateral upper and lower extremities with 5/5 strength in extensor  and flexor muscle groups.  Neurovascularly intact.  All major joints including the left shoulder, right shoulder, both elbows, wrist, bilateral ankles, knees, and hips without significant tenderness palpation and good range of motion.  Skin:    Findings: No rash.  Neurological:     Mental Status: She is alert.     ED Results / Procedures / Treatments   Labs (all labs ordered are listed, but only abnormal results are displayed) Labs Reviewed - No data to display  EKG None  Radiology CT Head Wo Contrast Result Date: 07/16/2023 CLINICAL DATA:  73 year old female status post tripped and fell, struck face on ground. Pain. EXAM: CT HEAD WITHOUT CONTRAST TECHNIQUE: Contiguous axial  images were obtained from the base of the skull through the vertex without intravenous contrast. RADIATION DOSE REDUCTION: This exam was performed according to the departmental dose-optimization program which includes automated exposure control, adjustment of the mA and/or kV according to patient size and/or use of iterative reconstruction technique. COMPARISON:  Head CT 07/02/2021. Face and cervical spine CT today reported separately. FINDINGS: Brain: Cerebral volume is within normal limits for age. No midline shift, ventriculomegaly, mass effect, evidence of mass lesion, intracranial hemorrhage or evidence of cortically based acute infarction. Gray-white matter differentiation is normal for age throughout the brain. Vascular: No suspicious intracranial vascular hyperdensity. Calcified atherosclerosis at the skull base. Skull: No acute fracture identified. Sinuses/Orbits: Visualized paranasal sinuses and mastoids are clear. Other: Left forehead scalp hematoma or contusion series 303, image 34. Underlying left frontal bone and sinus appear intact. Stable orbits soft tissues, face reported separately. IMPRESSION: 1. Left forehead scalp hematoma/contusion.  No calvarium fracture. 2. Normal for age noncontrast CT appearance of the brain. 3. Face CT reported separately. Electronically Signed   By: Odessa Fleming M.D.   On: 07/16/2023 11:59    Procedures Procedures    Medications Ordered in ED Medications  acetaminophen (TYLENOL) tablet 1,000 mg (1,000 mg Oral Given 07/16/23 1201)    ED Course/ Medical Decision Making/ A&P                                 Medical Decision Making Amount and/or Complexity of Data Reviewed Radiology: ordered.  Risk OTC drugs.   Medical Decision Making / ED Course   This patient presents to the ED for concern of fall, this involves an extensive number of treatment options, and is a complaint that carries with it a high risk of complications and morbidity.  The differential  diagnosis includes head injury, fracture, muscle strain  MDM: 73 year old female presents today for concern of fall that occurred just prior to arrival.  This was a mechanical fall.  No loss consciousness.  Not on anticoagulation.  Exam overall reassuring.  C-collar in place.  CT cervical spine, CT head, CT maxillofacial obtained through triage.  Currently pending.  Will add on left hip x-ray.  Good range of motion on exam.  Without significant tenderness to palpation.  CT head, CT cervical spine without acute findings.  CT maxillofacial shows chronic orbital fracture but no acute findings.  She does not have any tenderness to palpation of her orbital bones.  She states she did have a fall in the past but did not have imaging and was never told she had an orbital fracture.  Again she denies any current pain.  Good extraocular muscle movements.  Lab Tests: -I ordered, reviewed, and interpreted labs.   The  pertinent results include:   Labs Reviewed - No data to display    EKG  EKG Interpretation Date/Time:    Ventricular Rate:    PR Interval:    QRS Duration:    QT Interval:    QTC Calculation:   R Axis:      Text Interpretation:           Imaging Studies ordered: I ordered imaging studies including CT head, CT cervical spine, CT maxillofacial, left hip x-ray I independently visualized and interpreted imaging. I agree with the radiologist interpretation   Medicines ordered and prescription drug management: Meds ordered this encounter  Medications   acetaminophen (TYLENOL) tablet 1,000 mg    -I have reviewed the patients home medicines and have made adjustments as needed   Social Determinants of Health:  Factors impacting patients care include: Good follow-up   Reevaluation: After the interventions noted above, I reevaluated the patient and found that they have :improved  Co morbidities that complicate the patient evaluation  Past Medical History:  Diagnosis  Date   Hypertension       Dispostion: Discharged in stable condition.  Return precaution discussed.  Patient voices understanding and is in agreement with plan.   Final Clinical Impression(s) / ED Diagnoses Final diagnoses:  Fall, initial encounter    Rx / DC Orders ED Discharge Orders          Ordered    methocarbamol (ROBAXIN) 500 MG tablet  2 times daily        07/16/23 1420    lidocaine (LIDODERM) 5 %  Every 24 hours        07/16/23 1421              Marita Kansas, PA-C 07/16/23 1421    Long, Arlyss Repress, MD 07/19/23 [id number]

## 2023-07-16 NOTE — ED Triage Notes (Signed)
 Pt states that she tripped and fell and struck her face on the ground.  No LOC.  Pt is here for pain in her head and neck and shoulders.  Pt is alert and oriented and ambulatory to triage.  Placed in C-collar.

## 2023-07-16 NOTE — Discharge Instructions (Addendum)
 Your workup today was reassuring.  CT scan of your head, neck, face did not show any concerning acute fractures.  Left hip x-ray did not show any fractures.  Take Tylenol 1000 mg every 6 hours.  Take the muscle relaxer as needed.  Just be careful as it will make you drowsy and will increase the risk of fall.  Do not do anything dangerous after taking this medicine.  Return for any concerning symptoms.  Follow-up with your primary care doctor otherwise.

## 2023-09-22 IMAGING — CT CT HEAD W/O CM
3 of 4 series · 16 of 47 positions shown, 19 images · non-contrast
Comparison: None.

CLINICAL DATA: Trauma



[Series 3: head 2.0 h70h · axial · 0.44mm/px · z∈[-165,-15]mm · 10 of 85 slices shown, 13 images]
[im 5/85  brain]
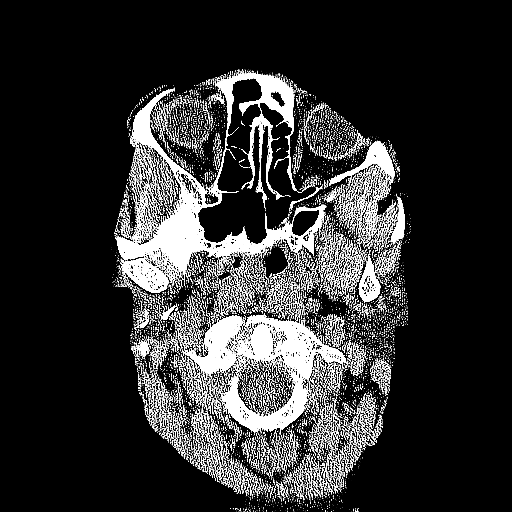
[im 5/85  bone]
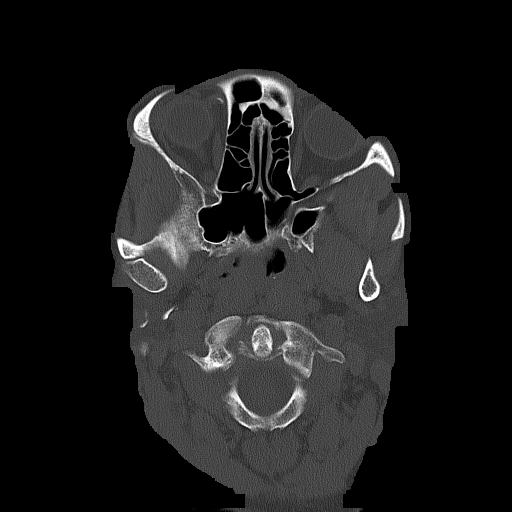
[im 13/85  brain]
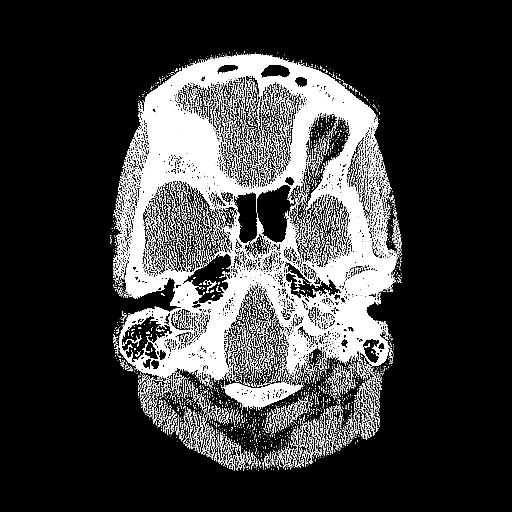
[im 22/85  brain]
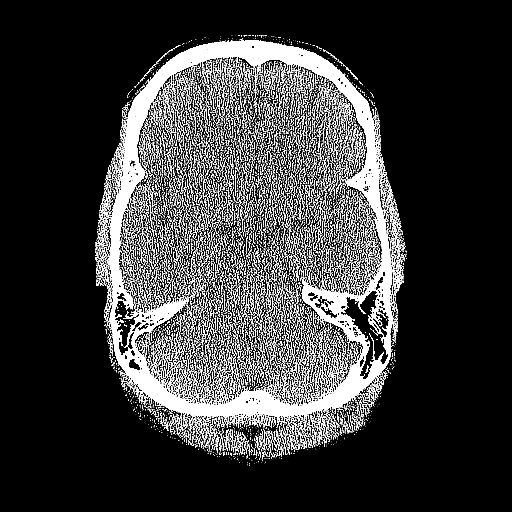
[im 30/85  brain]
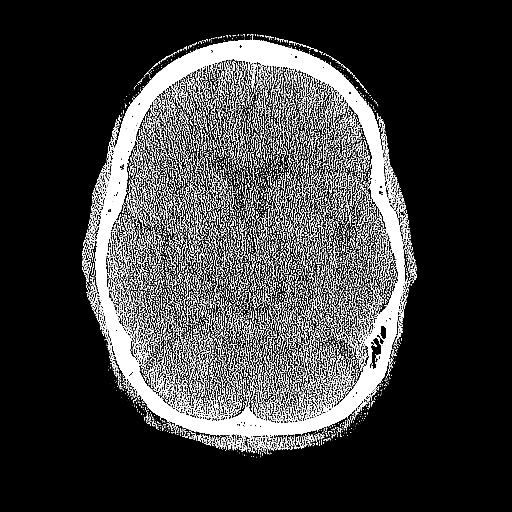
[im 38/85  brain]
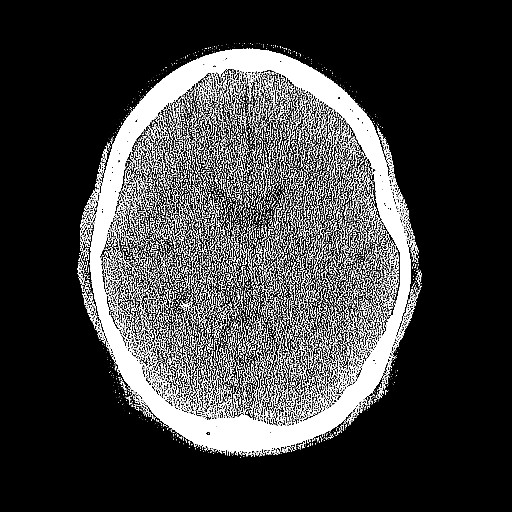
[im 38/85  bone]
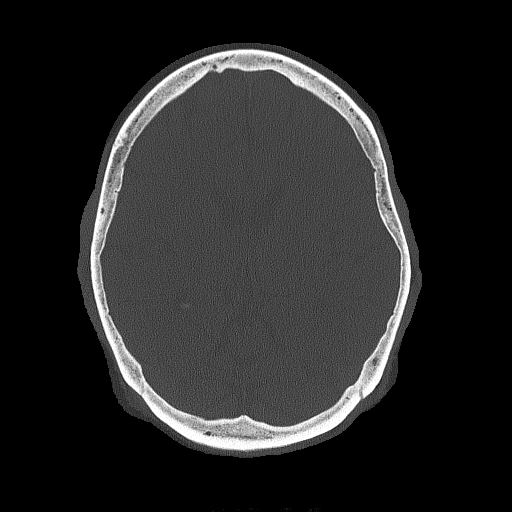
[im 47/85  brain]
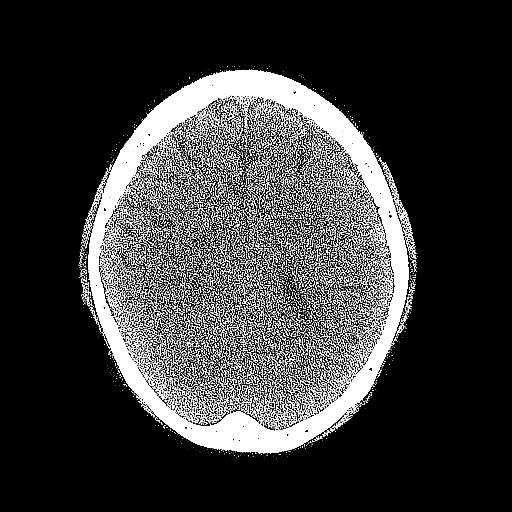
[im 55/85  brain]
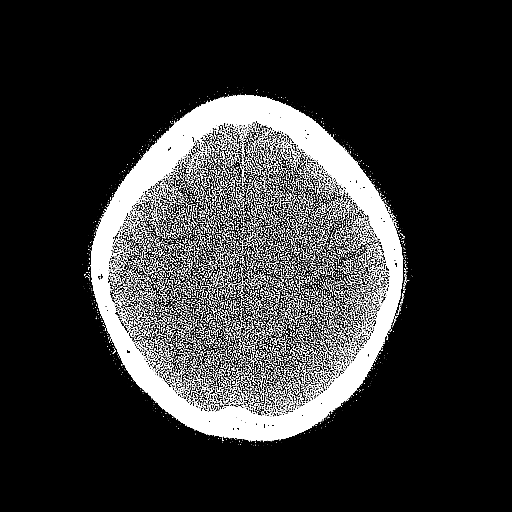
[im 64/85  brain]
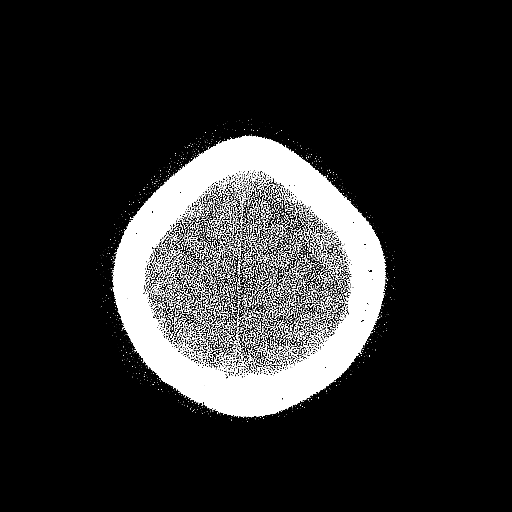
[im 72/85  brain]
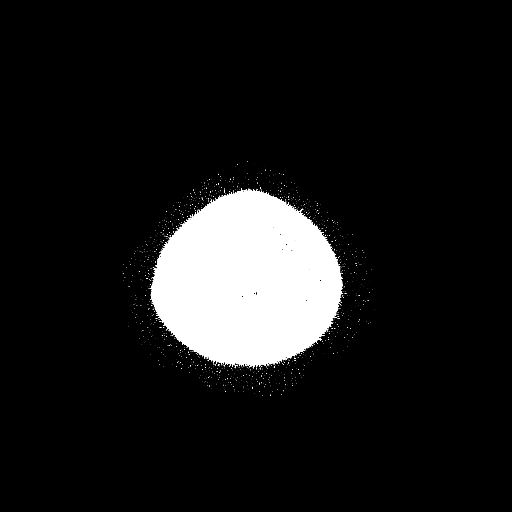
[im 72/85  bone]
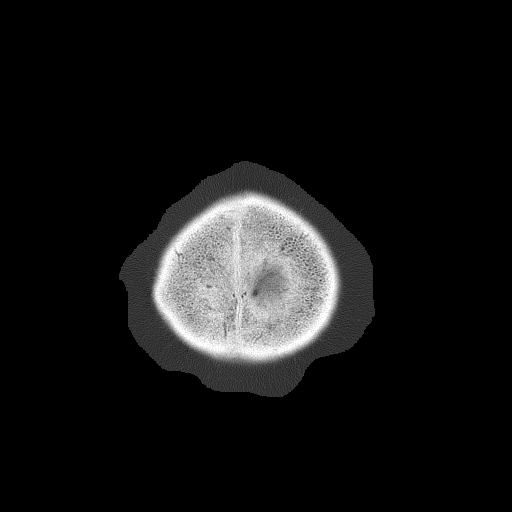
[im 80/85  brain]
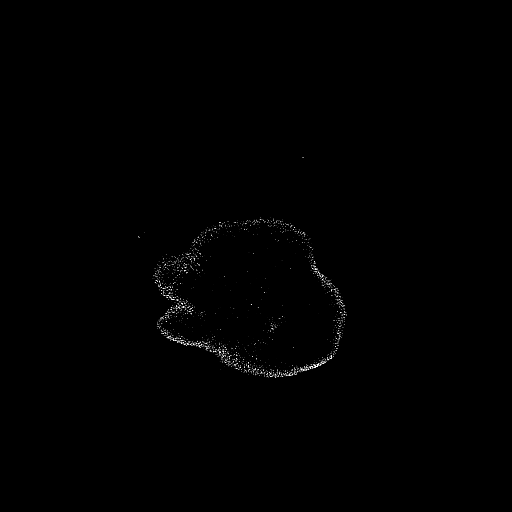

[Series 4: head 3.0 mpr cor · coronal · 0.34mm/px · 3 of 68 slices shown]
[im 23/68  brain]
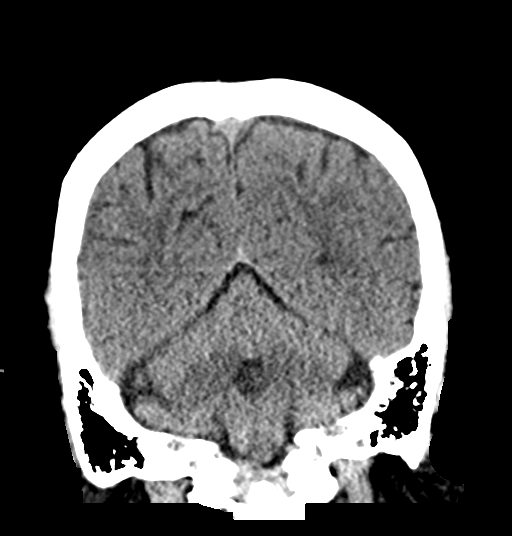
[im 30/68  brain]
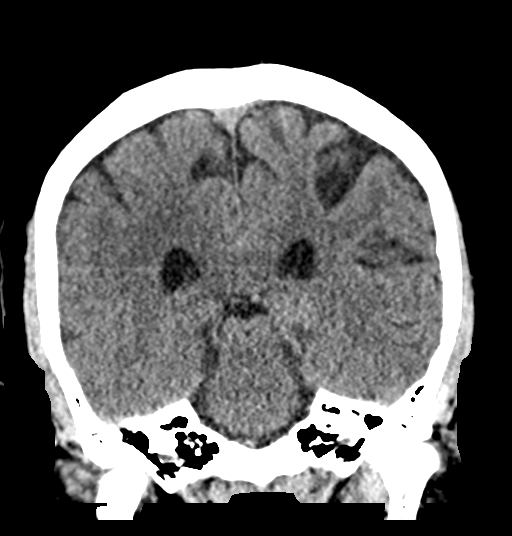
[im 38/68  brain]
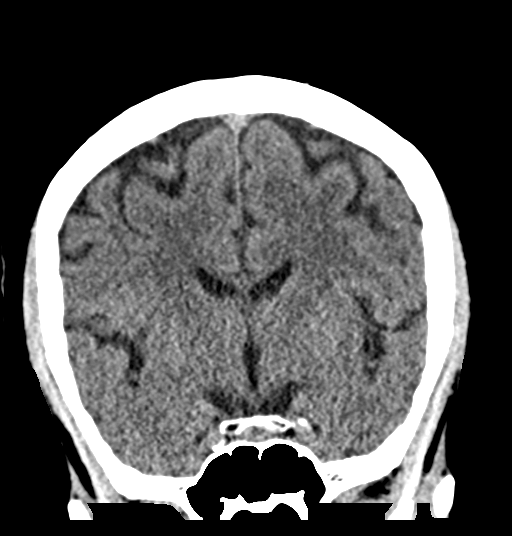

[Series 5: head 3.0 mpr sag · sagittal · 0.35mm/px · 3 of 58 slices shown]
[im 20/58  brain]
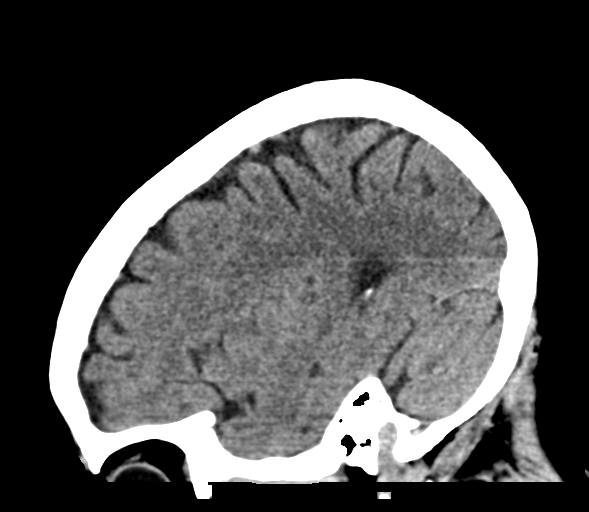
[im 29/58  brain]
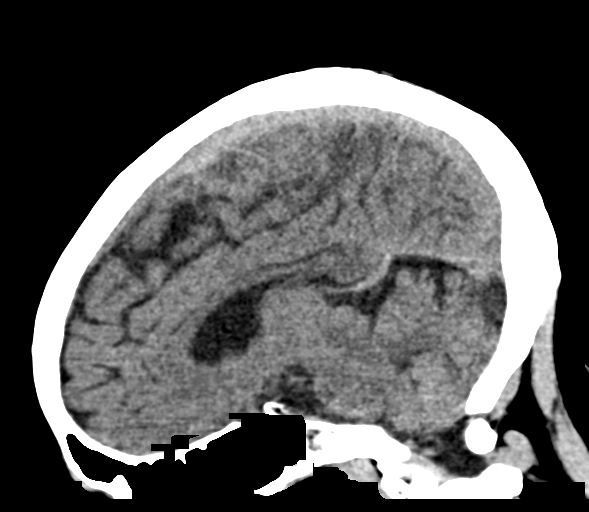
[im 39/58  brain]
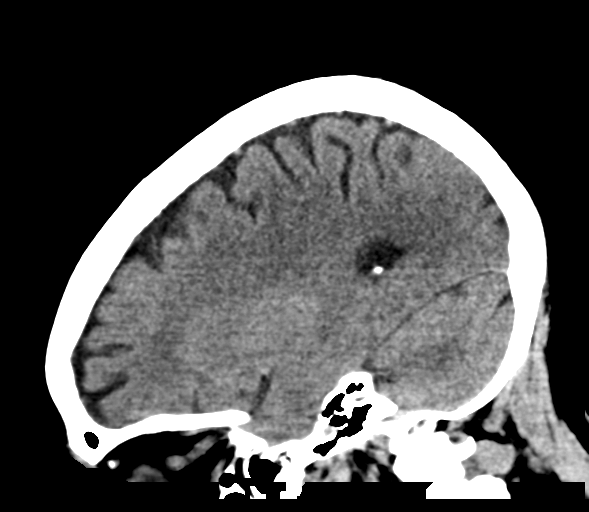

[16 of 47 positions shown; findings below may reference images not displayed]

FINDINGS: Brain: No acute intracranial findings are seen in noncontrast CT
brain. Cortical sulci are prominent. Ventricles are not dilated.

Vascular: Unremarkable.

Skull: No fracture is seen in the calvarium.

Sinuses/Orbits: Unremarkable.

Other: None
IMPRESSION: No acute intracranial findings are seen in noncontrast CT brain.
